# Patient Record
Sex: Female | Born: 1960 | Race: Black or African American | Hispanic: No | Marital: Married | Smoking: Current every day smoker
Health system: Southern US, Community
[De-identification: ages and names within clinical notes are randomized; demographics above are authoritative.]

## PROBLEM LIST (undated history)

## (undated) DIAGNOSIS — E785 Hyperlipidemia, unspecified: Secondary | ICD-10-CM

## (undated) DIAGNOSIS — Z923 Personal history of irradiation: Secondary | ICD-10-CM

## (undated) DIAGNOSIS — C50919 Malignant neoplasm of unspecified site of unspecified female breast: Secondary | ICD-10-CM

## (undated) DIAGNOSIS — Z972 Presence of dental prosthetic device (complete) (partial): Secondary | ICD-10-CM

## (undated) DIAGNOSIS — E119 Type 2 diabetes mellitus without complications: Secondary | ICD-10-CM

## (undated) HISTORY — PX: FOOT SURGERY: SHX648

---

## 1990-12-02 HISTORY — PX: TUBAL LIGATION: SHX77

## 2001-12-02 DIAGNOSIS — Z923 Personal history of irradiation: Secondary | ICD-10-CM

## 2001-12-02 DIAGNOSIS — C50919 Malignant neoplasm of unspecified site of unspecified female breast: Secondary | ICD-10-CM

## 2001-12-02 HISTORY — DX: Malignant neoplasm of unspecified site of unspecified female breast: C50.919

## 2001-12-02 HISTORY — PX: BREAST EXCISIONAL BIOPSY: SUR124

## 2001-12-02 HISTORY — PX: BREAST LUMPECTOMY: SHX2

## 2001-12-02 HISTORY — DX: Personal history of irradiation: Z92.3

## 2003-12-03 HISTORY — PX: TOTAL ABDOMINAL HYSTERECTOMY: SHX209

## 2004-09-01 ENCOUNTER — Ambulatory Visit: Payer: Self-pay | Admitting: Internal Medicine

## 2005-02-25 ENCOUNTER — Ambulatory Visit: Payer: Self-pay | Admitting: Internal Medicine

## 2005-03-02 ENCOUNTER — Ambulatory Visit: Payer: Self-pay | Admitting: Internal Medicine

## 2005-03-26 ENCOUNTER — Ambulatory Visit: Payer: Self-pay | Admitting: Internal Medicine

## 2005-04-01 ENCOUNTER — Ambulatory Visit: Payer: Self-pay | Admitting: Internal Medicine

## 2005-08-26 ENCOUNTER — Ambulatory Visit: Payer: Self-pay | Admitting: Internal Medicine

## 2005-09-01 ENCOUNTER — Ambulatory Visit: Payer: Self-pay | Admitting: Internal Medicine

## 2006-02-24 ENCOUNTER — Ambulatory Visit: Payer: Self-pay | Admitting: Internal Medicine

## 2006-03-02 ENCOUNTER — Ambulatory Visit: Payer: Self-pay | Admitting: Internal Medicine

## 2006-04-09 ENCOUNTER — Ambulatory Visit: Payer: Self-pay | Admitting: Internal Medicine

## 2006-04-24 ENCOUNTER — Ambulatory Visit: Payer: Self-pay | Admitting: Internal Medicine

## 2006-08-21 ENCOUNTER — Ambulatory Visit: Payer: Self-pay | Admitting: Internal Medicine

## 2006-09-01 ENCOUNTER — Ambulatory Visit: Payer: Self-pay | Admitting: Internal Medicine

## 2006-10-02 ENCOUNTER — Ambulatory Visit: Payer: Self-pay | Admitting: Internal Medicine

## 2006-11-01 ENCOUNTER — Ambulatory Visit: Payer: Self-pay | Admitting: Internal Medicine

## 2007-02-17 ENCOUNTER — Ambulatory Visit: Payer: Self-pay | Admitting: Internal Medicine

## 2007-03-03 ENCOUNTER — Ambulatory Visit: Payer: Self-pay | Admitting: Internal Medicine

## 2007-04-22 ENCOUNTER — Ambulatory Visit: Payer: Self-pay | Admitting: Internal Medicine

## 2007-04-23 ENCOUNTER — Ambulatory Visit: Payer: Self-pay | Admitting: Radiation Oncology

## 2007-05-03 ENCOUNTER — Ambulatory Visit: Payer: Self-pay | Admitting: Radiation Oncology

## 2007-06-09 ENCOUNTER — Ambulatory Visit: Payer: Self-pay | Admitting: Internal Medicine

## 2007-10-03 ENCOUNTER — Ambulatory Visit: Payer: Self-pay | Admitting: Internal Medicine

## 2007-10-16 ENCOUNTER — Ambulatory Visit: Payer: Self-pay | Admitting: Internal Medicine

## 2007-11-02 ENCOUNTER — Ambulatory Visit: Payer: Self-pay | Admitting: Internal Medicine

## 2008-04-01 ENCOUNTER — Ambulatory Visit: Payer: Self-pay | Admitting: Radiation Oncology

## 2008-04-29 ENCOUNTER — Ambulatory Visit: Payer: Self-pay | Admitting: Internal Medicine

## 2008-05-02 ENCOUNTER — Ambulatory Visit: Payer: Self-pay | Admitting: Internal Medicine

## 2008-05-02 ENCOUNTER — Ambulatory Visit: Payer: Self-pay | Admitting: Radiation Oncology

## 2008-10-02 ENCOUNTER — Ambulatory Visit: Payer: Self-pay | Admitting: Internal Medicine

## 2008-10-10 ENCOUNTER — Ambulatory Visit: Payer: Self-pay | Admitting: Internal Medicine

## 2008-11-01 ENCOUNTER — Ambulatory Visit: Payer: Self-pay | Admitting: Internal Medicine

## 2009-05-02 ENCOUNTER — Ambulatory Visit: Payer: Self-pay | Admitting: Internal Medicine

## 2010-06-12 ENCOUNTER — Ambulatory Visit: Payer: Self-pay | Admitting: Internal Medicine

## 2011-06-17 ENCOUNTER — Ambulatory Visit: Payer: Self-pay | Admitting: Internal Medicine

## 2012-06-17 ENCOUNTER — Ambulatory Visit: Payer: Self-pay | Admitting: Internal Medicine

## 2013-07-02 ENCOUNTER — Ambulatory Visit: Payer: Self-pay

## 2013-08-26 ENCOUNTER — Ambulatory Visit: Payer: Self-pay | Admitting: Gastroenterology

## 2013-08-26 HISTORY — PX: COLONOSCOPY: SHX174

## 2013-12-23 ENCOUNTER — Ambulatory Visit: Payer: Self-pay

## 2014-02-07 ENCOUNTER — Ambulatory Visit: Payer: Self-pay | Admitting: Family

## 2014-02-16 ENCOUNTER — Ambulatory Visit: Payer: Self-pay | Admitting: Gastroenterology

## 2014-02-16 HISTORY — PX: ESOPHAGOGASTRODUODENOSCOPY: SHX1529

## 2014-08-05 ENCOUNTER — Ambulatory Visit: Payer: Self-pay | Admitting: Physician Assistant

## 2015-02-07 ENCOUNTER — Ambulatory Visit: Payer: Self-pay | Admitting: Physician Assistant

## 2015-08-04 ENCOUNTER — Other Ambulatory Visit: Payer: Self-pay | Admitting: Physician Assistant

## 2015-08-04 DIAGNOSIS — R921 Mammographic calcification found on diagnostic imaging of breast: Secondary | ICD-10-CM

## 2015-08-18 ENCOUNTER — Ambulatory Visit
Admission: RE | Admit: 2015-08-18 | Discharge: 2015-08-18 | Disposition: A | Discharge status: Home / Self Care | Payer: PRIVATE HEALTH INSURANCE | Source: Ambulatory Visit | Attending: Physician Assistant | Admitting: Physician Assistant

## 2015-08-18 DIAGNOSIS — R921 Mammographic calcification found on diagnostic imaging of breast: Secondary | ICD-10-CM | POA: Insufficient documentation

## 2015-08-18 DIAGNOSIS — Z853 Personal history of malignant neoplasm of breast: Secondary | ICD-10-CM | POA: Insufficient documentation

## 2015-08-18 HISTORY — DX: Malignant neoplasm of unspecified site of unspecified female breast: C50.919

## 2015-10-11 HISTORY — PX: TRIGGER FINGER RELEASE: SHX641

## 2016-07-19 ENCOUNTER — Other Ambulatory Visit: Payer: Self-pay | Admitting: Physician Assistant

## 2016-07-19 DIAGNOSIS — C50912 Malignant neoplasm of unspecified site of left female breast: Secondary | ICD-10-CM

## 2016-08-20 ENCOUNTER — Ambulatory Visit
Admission: RE | Admit: 2016-08-20 | Discharge: 2016-08-20 | Disposition: A | Discharge status: Home / Self Care | Payer: PRIVATE HEALTH INSURANCE | Source: Ambulatory Visit | Attending: Physician Assistant | Admitting: Physician Assistant

## 2016-08-20 DIAGNOSIS — C50912 Malignant neoplasm of unspecified site of left female breast: Secondary | ICD-10-CM | POA: Insufficient documentation

## 2016-08-20 DIAGNOSIS — R921 Mammographic calcification found on diagnostic imaging of breast: Secondary | ICD-10-CM | POA: Insufficient documentation

## 2016-08-20 HISTORY — DX: Personal history of irradiation: Z92.3

## 2017-02-27 ENCOUNTER — Other Ambulatory Visit: Payer: Self-pay | Admitting: Student

## 2017-02-28 ENCOUNTER — Other Ambulatory Visit: Payer: Self-pay | Admitting: Student

## 2017-02-28 DIAGNOSIS — M779 Enthesopathy, unspecified: Principal | ICD-10-CM

## 2017-02-28 DIAGNOSIS — M778 Other enthesopathies, not elsewhere classified: Secondary | ICD-10-CM

## 2017-03-07 ENCOUNTER — Ambulatory Visit
Admission: RE | Admit: 2017-03-07 | Discharge: 2017-03-07 | Disposition: A | Discharge status: Home / Self Care | Payer: Managed Care, Other (non HMO) | Source: Ambulatory Visit | Attending: Student | Admitting: Student

## 2017-03-07 DIAGNOSIS — S46812A Strain of other muscles, fascia and tendons at shoulder and upper arm level, left arm, initial encounter: Secondary | ICD-10-CM | POA: Diagnosis not present

## 2017-03-07 DIAGNOSIS — M778 Other enthesopathies, not elsewhere classified: Secondary | ICD-10-CM

## 2017-03-07 DIAGNOSIS — X58XXXA Exposure to other specified factors, initial encounter: Secondary | ICD-10-CM | POA: Insufficient documentation

## 2017-03-07 DIAGNOSIS — M779 Enthesopathy, unspecified: Secondary | ICD-10-CM

## 2017-07-15 ENCOUNTER — Other Ambulatory Visit: Payer: Self-pay | Admitting: Physician Assistant

## 2017-07-15 DIAGNOSIS — Z1231 Encounter for screening mammogram for malignant neoplasm of breast: Secondary | ICD-10-CM

## 2017-08-21 ENCOUNTER — Ambulatory Visit
Admission: RE | Admit: 2017-08-21 | Discharge: 2017-08-21 | Disposition: A | Discharge status: Home / Self Care | Payer: Managed Care, Other (non HMO) | Source: Ambulatory Visit | Attending: Physician Assistant | Admitting: Physician Assistant

## 2017-08-21 DIAGNOSIS — Z1231 Encounter for screening mammogram for malignant neoplasm of breast: Secondary | ICD-10-CM | POA: Diagnosis not present

## 2017-09-10 ENCOUNTER — Encounter: Payer: Self-pay | Admitting: *Deleted

## 2017-09-17 ENCOUNTER — Ambulatory Visit
Admission: RE | Admit: 2017-09-17 | Discharge: 2017-09-17 | Disposition: A | Discharge status: Home / Self Care | Payer: Managed Care, Other (non HMO) | Source: Ambulatory Visit | Attending: Surgery | Admitting: Surgery

## 2017-09-17 ENCOUNTER — Ambulatory Visit: Payer: Managed Care, Other (non HMO) | Admitting: Student in an Organized Health Care Education/Training Program

## 2017-09-17 ENCOUNTER — Encounter
Admission: RE | Disposition: A | Discharge status: Home / Self Care | Payer: Self-pay | Source: Ambulatory Visit | Attending: Surgery

## 2017-09-17 DIAGNOSIS — M7712 Lateral epicondylitis, left elbow: Secondary | ICD-10-CM | POA: Diagnosis present

## 2017-09-17 DIAGNOSIS — Z7984 Long term (current) use of oral hypoglycemic drugs: Secondary | ICD-10-CM | POA: Diagnosis not present

## 2017-09-17 DIAGNOSIS — K760 Fatty (change of) liver, not elsewhere classified: Secondary | ICD-10-CM | POA: Diagnosis not present

## 2017-09-17 DIAGNOSIS — E781 Pure hyperglyceridemia: Secondary | ICD-10-CM | POA: Insufficient documentation

## 2017-09-17 DIAGNOSIS — E119 Type 2 diabetes mellitus without complications: Secondary | ICD-10-CM | POA: Diagnosis not present

## 2017-09-17 DIAGNOSIS — Z87891 Personal history of nicotine dependence: Secondary | ICD-10-CM | POA: Insufficient documentation

## 2017-09-17 DIAGNOSIS — Z79899 Other long term (current) drug therapy: Secondary | ICD-10-CM | POA: Diagnosis not present

## 2017-09-17 DIAGNOSIS — Z853 Personal history of malignant neoplasm of breast: Secondary | ICD-10-CM | POA: Insufficient documentation

## 2017-09-17 HISTORY — DX: Type 2 diabetes mellitus without complications: E11.9

## 2017-09-17 HISTORY — PX: ELBOW ARTHROSCOPY: SHX614

## 2017-09-17 HISTORY — DX: Presence of dental prosthetic device (complete) (partial): Z97.2

## 2017-09-17 HISTORY — DX: Hyperlipidemia, unspecified: E78.5

## 2017-09-17 LAB — GLUCOSE, CAPILLARY
GLUCOSE-CAPILLARY: 104 mg/dL — AB (ref 65–99)
Glucose-Capillary: 89 mg/dL (ref 65–99)

## 2017-09-17 SURGERY — ARTHROSCOPY, ELBOW, WITH OPEN SURGERY IF INDICATED
Anesthesia: General | Laterality: Left | Wound class: Clean

## 2017-09-17 MED ORDER — PROPOFOL 10 MG/ML IV BOLUS
INTRAVENOUS | Status: DC | PRN
  Administered 2017-09-17: 200 mg via INTRAVENOUS

## 2017-09-17 MED ORDER — HYDROCODONE-ACETAMINOPHEN 5-325 MG PO TABS: 1.0000 | ORAL_TABLET | Freq: Four times a day (QID) | ORAL | 0 refills | Status: AC | PRN

## 2017-09-17 MED ORDER — BUPIVACAINE HCL (PF) 0.5 % IJ SOLN
INTRAMUSCULAR | Status: DC | PRN
  Administered 2017-09-17: 15 mL

## 2017-09-17 MED ORDER — MIDAZOLAM HCL 2 MG/2ML IJ SOLN
INTRAMUSCULAR | Status: DC | PRN
  Administered 2017-09-17: 2 mg via INTRAVENOUS

## 2017-09-17 MED ORDER — DEXAMETHASONE SODIUM PHOSPHATE 4 MG/ML IJ SOLN
INTRAMUSCULAR | Status: DC | PRN
  Administered 2017-09-17: 8 mg via INTRAVENOUS

## 2017-09-17 MED ORDER — SODIUM CHLORIDE 0.9 % IV SOLN
2000.0000 mg | Freq: Once | INTRAVENOUS | Status: AC
  Administered 2017-09-17: 2000 mg via INTRAVENOUS

## 2017-09-17 MED ORDER — LIDOCAINE HCL (CARDIAC) 20 MG/ML IV SOLN
INTRAVENOUS | Status: DC | PRN
  Administered 2017-09-17: 50 mg via INTRATRACHEAL

## 2017-09-17 MED ORDER — LACTATED RINGERS IV SOLN
10.0000 mL/h | INTRAVENOUS | Status: DC
  Administered 2017-09-17: 10 mL/h via INTRAVENOUS

## 2017-09-17 MED ORDER — FENTANYL CITRATE (PF) 100 MCG/2ML IJ SOLN
INTRAMUSCULAR | Status: DC | PRN
  Administered 2017-09-17: 100 ug via INTRAVENOUS

## 2017-09-17 MED ORDER — ONDANSETRON HCL 4 MG/2ML IJ SOLN
INTRAMUSCULAR | Status: DC | PRN
  Administered 2017-09-17: 4 mg via INTRAVENOUS

## 2017-09-17 MED ORDER — ROPIVACAINE HCL 5 MG/ML IJ SOLN
INTRAMUSCULAR | Status: DC | PRN
  Administered 2017-09-17: 30 mL via EPIDURAL

## 2017-09-17 SURGICAL SUPPLY — 40 items
ANCHOR SUT 1.45 SZ 1 SHORT (Anchor) ×3 IMPLANT
BANDAGE ELASTIC 4 CLIP ST LF (GAUZE/BANDAGES/DRESSINGS) ×3 IMPLANT
BLADE EAR TYMPAN 2.5 60D BEAV (BLADE) IMPLANT
BLADE FULL RADIUS 3.5 (BLADE) IMPLANT
BNDG ESMARK 4X12 TAN STRL LF (GAUZE/BANDAGES/DRESSINGS) ×3 IMPLANT
BUR ABRADER 4.0 W/FLUTE AQUA (MISCELLANEOUS) IMPLANT
BURR ABRADER 4.0 W/FLUTE AQUA (MISCELLANEOUS)
CANNULA SHAVER SCOPE W/RIB (CANNULA) IMPLANT
CHLORAPREP W/TINT 26ML (MISCELLANEOUS) ×3 IMPLANT
COVER LIGHT HANDLE UNIVERSAL (MISCELLANEOUS) ×6 IMPLANT
CUFF TOURN SGL QUICK 18 (TOURNIQUET CUFF) ×3 IMPLANT
DRAPE IMP U-DRAPE 54X76 (DRAPES) ×3 IMPLANT
DRAPE SHEET LG 3/4 BI-LAMINATE (DRAPES) ×3 IMPLANT
DRAPE SURG 17X11 SM STRL (DRAPES) ×3 IMPLANT
GAUZE SPONGE 4X4 12PLY STRL (GAUZE/BANDAGES/DRESSINGS) ×3 IMPLANT
GLOVE BIO SURGEON STRL SZ8 (GLOVE) ×6 IMPLANT
GLOVE INDICATOR 8.0 STRL GRN (GLOVE) ×3 IMPLANT
GOWN STRL REUS W/ TWL LRG LVL3 (GOWN DISPOSABLE) ×1 IMPLANT
GOWN STRL REUS W/ TWL XL LVL3 (GOWN DISPOSABLE) ×1 IMPLANT
GOWN STRL REUS W/TWL LRG LVL3 (GOWN DISPOSABLE) ×2
GOWN STRL REUS W/TWL XL LVL3 (GOWN DISPOSABLE) ×2
IV LACTATED RINGER IRRG 3000ML (IV SOLUTION)
IV LR IRRIG 3000ML ARTHROMATIC (IV SOLUTION) IMPLANT
KIT ROOM TURNOVER OR (KITS) ×3 IMPLANT
MANIFOLD 4PT FOR NEPTUNE1 (MISCELLANEOUS) IMPLANT
MAT BLUE FLOOR 46X72 FLO (MISCELLANEOUS) IMPLANT
NEEDLE 18GX1X1/2 (RX/OR ONLY) (NEEDLE) IMPLANT
NEEDLE HYPO 21X1.5 SAFETY (NEEDLE) ×3 IMPLANT
PACK ARTHROSCOPY KNEE (MISCELLANEOUS) ×3 IMPLANT
PAD GROUND ADULT SPLIT (MISCELLANEOUS) ×3 IMPLANT
SLING ARM LRG DEEP (SOFTGOODS) IMPLANT
SLING ARM M TX990204 (SOFTGOODS) ×3 IMPLANT
SPLINT WRIST M LT TX990308 (SOFTGOODS) ×3 IMPLANT
STRAP BODY AND KNEE 60X3 (MISCELLANEOUS) ×6 IMPLANT
SUT PROLENE 4 0 PS 2 18 (SUTURE) ×6 IMPLANT
SUT VIC AB 3-0 SH 27 (SUTURE)
SUT VIC AB 3-0 SH 27X BRD (SUTURE) IMPLANT
SYR 20CC LL (SYRINGE) ×3 IMPLANT
TUBING ARTHRO INFLOW-ONLY STRL (TUBING) IMPLANT
WAND HAND CNTRL MULTIVAC 90 (MISCELLANEOUS) IMPLANT

## 2017-09-17 NOTE — Transfer of Care (Signed)
Immediate Anesthesia Transfer of Care Note  Patient: Bethany Mcmillan  Procedure(s) Performed: OPEN DEBRIDEMENT OF THE COMMON EXTENSOR ORIGIN OF ELBOW (Left )  Patient Location: PACU  Anesthesia Type: General  Level of Consciousness: awake, alert  and patient cooperative  Airway and Oxygen Therapy: Patient Spontanous Breathing and Patient connected to supplemental oxygen  Post-op Assessment: Post-op Vital signs reviewed, Patient's Cardiovascular Status Stable, Respiratory Function Stable, Patent Airway and No signs of Nausea or vomiting  Post-op Vital Signs: Reviewed and stable  Complications: No apparent anesthesia complications

## 2017-09-17 NOTE — Discharge Instructions (Signed)
General Anesthesia, Adult, Care After These instructions provide you with information about caring for yourself after your procedure. Your health care provider may also give you more specific instructions. Your treatment has been planned according to current medical practices, but problems sometimes occur. Call your health care provider if you have any problems or questions after your procedure. What can I expect after the procedure? After the procedure, it is common to have:  Vomiting.  A sore throat.  Mental slowness.  It is common to feel:  Nauseous.  Cold or shivery.  Sleepy.  Tired.  Sore or achy, even in parts of your body where you did not have surgery.  Follow these instructions at home: For at least 24 hours after the procedure:  Do not: ? Participate in activities where you could fall or become injured. ? Drive. ? Use heavy machinery. ? Drink alcohol. ? Take sleeping pills or medicines that cause drowsiness. ? Make important decisions or sign legal documents. ? Take care of children on your own.  Rest. Eating and drinking  If you vomit, drink water, juice, or soup when you can drink without vomiting.  Drink enough fluid to keep your urine clear or pale yellow.  Make sure you have little or no nausea before eating solid foods.  Follow the diet recommended by your health care provider. General instructions  Have a responsible adult stay with you until you are awake and alert.  Return to your normal activities as told by your health care provider. Ask your health care provider what activities are safe for you.  Take over-the-counter and prescription medicines only as told by your health care provider.  If you smoke, do not smoke without supervision.  Keep all follow-up visits as told by your health care provider. This is important. Contact a health care provider if:  You continue to have nausea or vomiting at home, and medicines are not helpful.  You  cannot drink fluids or start eating again.  You cannot urinate after 8-12 hours.  You develop a skin rash.  You have fever.  You have increasing redness at the site of your procedure. Get help right away if:  You have difficulty breathing.  You have chest pain.  You have unexpected bleeding.  You feel that you are having a life-threatening or urgent problem. This information is not intended to replace advice given to you by your health care provider. Make sure you discuss any questions you have with your health care provider. Document Released: 02/24/2001 Document Revised: 04/22/2016 Document Reviewed: 11/02/2015 Elsevier Interactive Patient Education  2018 [employer].  Keep dressing dry and intact. Keep arm and hand elevated above heart level. May shower after dressing removed on postop day 4 (Sunday). Reapply Velcro wrist splint after drying off. Cover sutures with Band-Aids after drying off. Apply ice to affected area frequently. Take ibuprofen 600 mg TID with meals for 7-10 days, then as necessary. Take pain medication as prescribed or ES Tylenol when needed.  Return for follow-up in 10-14 days or as scheduled.

## 2017-09-17 NOTE — Op Note (Signed)
09/17/2017  1:45 PM  Patient:   Bethany Mcmillan  Pre-Op Diagnosis:   Chronic lateral epicondylitis, left elbow.  Post-Op Diagnosis:   Same.  Procedure:   Debridement/repair of common extensor origin, left elbow.  Surgeon:   Pascal Lux, MD  Assistant:   None  Anesthesia:   General LMA with interscalene block placed preoperatively by the anesthesiologist  Findings:   As above.  Complications:   None  EBL:   3 cc  Fluids:   900 cc crystalloid  TT:   28 minutes at 250 mmHg  Drains:   None  Closure:   3-0 Vicryl subcuticular sutures  Implants:   Biomet JuggerKnot 1.4 mm anchor x1  Brief Clinical Note:   The patient is a 56 year old female with a history of gradually worsening lateral sided left elbow pain. Her symptoms have persisted despite medications, activity modification, use of a tennis elbow strap, injections, etc. Her history and examination are consistent with chronic lateral epicondylitis. An MRI scan of the elbow demonstrated a partial-thickness tear of the common extensor origin. The patient presents at this time for debridement and repair of the common extensor origin of his left elbow.  Procedure:   The patient underwent placement of an interscalene block in the pre-operative holding area by the anesthesiologist before she was brought into the operating room and lain in the supine position. After adequate general laryngal mask anesthesia was obtained, the patient's left upper extremity was prepped with ChloraPrep solution before being draped sterilely. Preoperative antibiotics were administered. A timeout was performed to verify the appropriate surgical site before the limb was exsanguinated with an Esmarch and the tourniquet inflated to 250 mmHg. An approximately 3-4 cm incision was made over the lateral aspect of the elbow beginning at the lateral epicondyle and extending distally in line with the common extensor origin tendons. The incision was carried down  through the subcutaneous tissues to expose the common extensor origin. The extensor carpi radialis brevis tendon was identified and incised in line with the incision. The areas of significantly degenerative tendinous tissues were debrided sharply with a #15 blade down to the epicondylar region. The bone itself was roughened with a rongeur to provide a good bleeding surface for reattachment of the tendon. A Biomet 1.4 mm JuggerKnot anchor was inserted into the exposed bone of the lateral epicondyle. The sutures were passed through the tendon and tied securely to effect the repair. The wound was copiously irrigated with bacitracin saline solution using bulb irrigation before several 2-0 Vicryl interrupted sutures were used to close the longitudinal incision in the tendon in a side-to-side fashion. The subcutaneous tissues were closed using 2-0 Vicryl interrupted sutures before the skin was closed using 3-0 Vicryl subcuticular sutures. Benzoin and Steri-Strips were applied to the skin. A total of 15 cc of 0.5% plain Sensorcaine was injected in and around the incision to help with postoperative analgesia before a sterile bulky dressing was applied to the elbow. A Velcro wrist immobilizer was applied before the arm was placed into a sling. The patient then was awakened, extubated, and returned to the recovery room in satisfactory condition after tolerating the procedure well.

## 2017-09-17 NOTE — Anesthesia Postprocedure Evaluation (Signed)
Anesthesia Post Note  Patient: Bethany Mcmillan  Procedure(s) Performed: OPEN DEBRIDEMENT OF THE COMMON EXTENSOR ORIGIN OF ELBOW (Left )  Patient location during evaluation: PACU Anesthesia Type: General Level of consciousness: awake and alert Pain management: pain level controlled Vital Signs Assessment: post-procedure vital signs reviewed and stable Respiratory status: spontaneous breathing, nonlabored ventilation, respiratory function stable and patient connected to nasal cannula oxygen Cardiovascular status: blood pressure returned to baseline and stable Postop Assessment: no apparent nausea or vomiting Anesthetic complications: no    Sultana Tierney ELAINE

## 2017-09-17 NOTE — Anesthesia Procedure Notes (Signed)
Procedure Name: LMA Insertion Date/Time: 09/17/2017 12:56 PM Performed by: Londell Moh Pre-anesthesia Checklist: Patient identified, Emergency Drugs available, Suction available, Timeout performed and Patient being monitored Patient Re-evaluated:Patient Re-evaluated prior to induction Oxygen Delivery Method: Circle system utilized Preoxygenation: Pre-oxygenation with 100% oxygen Induction Type: IV induction LMA: LMA inserted LMA Size: 4.0 Number of attempts: 1 Placement Confirmation: positive ETCO2 and breath sounds checked- equal and bilateral Tube secured with: Tape

## 2017-09-17 NOTE — H&P (Signed)
Paper H&P to be scanned into permanent record. H&P reviewed and patient re-examined. No changes. 

## 2017-09-17 NOTE — Anesthesia Preprocedure Evaluation (Addendum)
Anesthesia Evaluation  Patient identified by MRN, date of birth, ID band Patient awake    Reviewed: Allergy & Precautions, H&P , NPO status , Patient's Chart, lab work & pertinent test results, reviewed documented beta blocker date and time   Airway Mallampati: II  TM Distance: >3 FB Neck ROM: full    Dental  (+) Partial Upper   Pulmonary neg pulmonary ROS, Current Smoker,    Pulmonary exam normal breath sounds clear to auscultation       Cardiovascular Exercise Tolerance: Good negative cardio ROS   Rhythm:regular Rate:Normal     Neuro/Psych negative neurological ROS  negative psych ROS   GI/Hepatic negative GI ROS, Neg liver ROS,   Endo/Other  negative endocrine ROSdiabetes  Renal/GU negative Renal ROS  negative genitourinary   Musculoskeletal   Abdominal   Peds  Hematology negative hematology ROS (+)   Anesthesia Other Findings Breast CA - treated with radiation  Reproductive/Obstetrics negative OB ROS                             Anesthesia Physical Anesthesia Plan  ASA: II  Anesthesia Plan: General   Post-op Pain Management: GA combined w/ Regional for post-op pain   Induction:   PONV Risk Score and Plan:   Airway Management Planned:   Additional Equipment:   Intra-op Plan:   Post-operative Plan:   Informed Consent: I have reviewed the patients History and Physical, chart, labs and discussed the procedure including the risks, benefits and alternatives for the proposed anesthesia with the patient or authorized representative who has indicated his/her understanding and acceptance.   Dental Advisory Given  Plan Discussed with: CRNA  Anesthesia Plan Comments:        Anesthesia Quick Evaluation

## 2017-09-17 NOTE — Anesthesia Procedure Notes (Signed)
Anesthesia Regional Block: Interscalene brachial plexus block   Pre-Anesthetic Checklist: ,, timeout performed, Correct Patient, Correct Site, Correct Laterality, Correct Procedure, Correct Position, site marked, Risks and benefits discussed,  Surgical consent,  Pre-op evaluation,  At surgeon's request and post-op pain management  Laterality: Left  Prep: chloraprep       Needles:  Injection technique: Single-shot  Needle Type: Stimulator Needle - 40     Needle Length: 4cm  Needle Gauge: 22     Additional Needles:   Procedures:,,,, ultrasound used (permanent image in chart),,,,  Narrative:  Start time: 09/17/2017 11:50 AM End time: 09/17/2017 11:55 AM Injection made incrementally with aspirations every 5 mL.  Performed by: Personally  Anesthesiologist: Marice Potter  Additional Notes: Pt tolerated procedure well.

## 2017-09-17 NOTE — Progress Notes (Signed)
Assisted Scoures with left, ultrasound guided, interscalene , supraclavicular block. Side rails up, monitors on throughout procedure. See vital signs in flow sheet. Tolerated Procedure well.

## 2017-09-18 ENCOUNTER — Encounter: Payer: Self-pay | Admitting: Surgery

## 2017-10-30 ENCOUNTER — Encounter: Payer: Self-pay | Admitting: Occupational Therapy

## 2017-10-30 ENCOUNTER — Ambulatory Visit: Payer: Managed Care, Other (non HMO) | Attending: Surgery | Admitting: Occupational Therapy

## 2017-10-30 DIAGNOSIS — M25632 Stiffness of left wrist, not elsewhere classified: Secondary | ICD-10-CM

## 2017-10-30 DIAGNOSIS — M6281 Muscle weakness (generalized): Secondary | ICD-10-CM | POA: Diagnosis present

## 2017-10-30 DIAGNOSIS — M25622 Stiffness of left elbow, not elsewhere classified: Secondary | ICD-10-CM | POA: Insufficient documentation

## 2017-10-30 DIAGNOSIS — M25522 Pain in left elbow: Secondary | ICD-10-CM

## 2017-10-30 DIAGNOSIS — L905 Scar conditions and fibrosis of skin: Secondary | ICD-10-CM | POA: Diagnosis present

## 2017-10-30 NOTE — Patient Instructions (Signed)
Can do heat on elbow  Scar massage and cica scar pad for night time use  tubigrip over elbow  Counter force splint or wrist splint -fitted Benik soft neoprene splint to wean into - should not increase pain   Prayer stretch for wrist extention  Wrist flexion AROM  Elbow to side in neutral Close fist wrist flexion AROM  Pronation  If pain free and can do 2 sets   Try extended arm  Same to AROM  Pain free it should be - try in 3-4 days   Elbow extention and flexion -with shoulder block against wall - do 3 positions - 5-8 reps  Ice at end

## 2017-10-30 NOTE — Therapy (Signed)
[facility] [address], [place], [address] Phone: [phone]   Fax:  [phone]  Occupational Therapy Evaluation  Patient Details  Name: Bethany Mcmillan MRN: [id number] Date of Birth: 1961/04/07 Referring Provider: Roland Rack   Encounter Date: 10/30/2017  OT End of Session - 10/30/17 2049    Visit Number  1    Number of Visits  6    Date for OT Re-Evaluation  12/11/17    OT Start Time  1301    OT Stop Time  1355    OT Time Calculation (min)  54 min    Activity Tolerance  Patient tolerated treatment well    Behavior During Therapy  [facility] for tasks assessed/performed       Past Medical History:  Diagnosis Date  . Breast cancer ([place]) 2003   left breast w/ radiation  . Breast cancer ([place])   . Diabetes mellitus, type 2 ([place])   . Hyperlipidemia   . Personal history of radiation therapy 2003   BREAST CA  . Wears dentures    partial upper    Past Surgical History:  Procedure Laterality Date  . BREAST EXCISIONAL BIOPSY Left 2003   positive  . COLONOSCOPY  08/26/2013  . ELBOW ARTHROSCOPY Left 09/17/2017   Procedure: OPEN DEBRIDEMENT OF THE COMMON EXTENSOR ORIGIN OF ELBOW;  Surgeon: Corky Mull, MD;  Location: [facility];  Service: Orthopedics;  Laterality: Left;  Diabetic - oral meds  . ESOPHAGOGASTRODUODENOSCOPY  02/16/2014  . FOOT SURGERY Right   . TOTAL ABDOMINAL HYSTERECTOMY  2005  . TRIGGER FINGER RELEASE Right 10/11/2015   thumb  . TUBAL LIGATION  1992    There were no vitals filed for this visit.  Subjective Assessment - 10/30/17 2037    Subjective   I cannot remember that  I did anything to cause elbow pain - had 2 shots but did not help - so had surgery 09/17/17 - dong okay - but  I think I can do better without this wrist splint and then I cannot keep elbow strap on - keeps on sliding down     Patient Stated Goals  I want to get my splint off, get my motion better and pain - so  I can use my hand and arm without pain     Currently in Pain?  Yes    Pain Score  5     Pain Location  Elbow    Pain Orientation  Left    Pain Descriptors / Indicators  Sharp;Shooting    Pain Type  Surgical pain    Pain Onset  More than a month ago    Aggravating Factors   after using it some         [facility] OT Assessment - 10/30/17 0001      Assessment   Diagnosis  L elbow debridement of common extensor     Referring Provider  Poggi    Onset Date  09/17/17      Precautions   Required Braces or Orthoses  -- Wrist splint or elbow counter force brace       Balance Screen   Has the patient fallen in the past 6 months  No      Home  Environment   Lives With  Spouse      Prior Function   Vocation  Full time employment    Leisure  light duty as Architect,  likes to do  crafts,  jig saw puzzles, tablet read and games , R hand dominant       AROM   Left Elbow Flexion  140    Left Elbow Extension  -15    Right Wrist Extension  80 Degrees    Right Wrist Flexion  85 Degrees    Left Wrist Extension  60 Degrees    Left Wrist Flexion  70 Degrees        fluidotherapy done to increase ROM in L wrist - able to move better   pt educated on HEP   Can do heat on elbow  Scar massage and cica scar pad for night time use  tubigrip over elbow  Counter force splint or wrist splint -fitted Benik soft neoprene splint to wean into - should not increase pain   Prayer stretch for wrist extention  Wrist flexion AROM  Elbow to side in neutral Close fist wrist flexion AROM  Pronation  If pain free and can do 2 sets   Try extended arm  Same to AROM  Pain free it should be - try in 3-4 days   Elbow extention and flexion -with shoulder block against wall - do 3 positions - 5-8 reps  Ice at end                OT Education - 10/30/17 2048    Education provided  Yes    Education Details  Findings discuss and HEP review    Person(s) Educated  Patient    Methods   Explanation;Demonstration;Tactile cues;Verbal cues;Handout    Comprehension  Verbalized understanding;Need further instruction;Returned demonstration       OT Short Term Goals - 10/30/17 2056      OT SHORT TERM GOAL #1   Title  Pain on PREE improve with more than 15 points     Baseline  Pain at eval on PREE is 23/50     Time  3    Period  Weeks    Status  New    Target Date  11/20/17      OT SHORT TERM GOAL #2   Title  L wrist and elbow AROM improve to WNL without increase of symptoms to initiate strengthing and  use in bathing , dressing     Baseline  see flowsheet for ROM     Time  4    Period  Weeks    Status  New    Target Date  11/27/17      OT SHORT TERM GOAL #3   Title  Pt to be independent in HEP to wear splints correctly to decrease pain and to  increase ROM and  strength in L wrist and elbow     Baseline  very little knowledge on HEP  and pain 5-10/10 per pt     Time  3    Period  Weeks    Status  New    Target Date  11/20/17        OT Long Term Goals - 10/30/17 2059      OT LONG TERM GOAL #1   Title  Pt to show increase strength in L wrist and elbow to carry 5 lbs and use hand in ADL's and crafts without increase of symptoms     Baseline  no strengthing yet -and on 5 lbs weight limited , no pushing or pulling     Time  6    Period  Weeks    Status  New  Target Date  12/11/17      OT LONG TERM GOAL #2   Title  Function score on PREE improve with more than 10 points     Baseline  Function score on PREE 17/50     Time  6    Period  Weeks    Status  New    Target Date  12/11/17            Plan - 10/30/17 2050    Clinical Impression Statement  Pt present at OT evaluation about 6 1/2 wks s/p from L elbow debridement of extensory tendon commons - pt show increase pain and scar tissuse , decrease ROM at elbow and wrist , decrease strength - limiting her functional use of L hand in ADL's and IADL's     Occupational performance deficits (Please refer to  evaluation for details):  ADL's;IADL's;Work;Play;Leisure    Rehab Potential  Good    OT Frequency  1x / week    OT Duration  6 weeks    OT Treatment/Interventions  Self-care/ADL training;Patient/family education;Splinting;Fluidtherapy;Moist Heat;Cryotherapy;Manual Therapy;Passive range of motion;Scar mobilization;Therapeutic exercise;Therapeutic exercises    Plan  assess progress with HEP and upgrade as needed     Clinical Decision Making  Limited treatment options, no task modification necessary    OT Home Exercise Plan  see pt instruction     Consulted and Agree with Plan of Care  Patient       Patient will benefit from skilled therapeutic intervention in order to improve the following deficits and impairments:  Pain, Impaired flexibility, Increased edema, Impaired sensation, Decreased scar mobility, Decreased range of motion, Decreased strength, Impaired UE functional use  Visit Diagnosis: Pain in left elbow - Plan: Ot plan of care cert/re-cert  Stiffness of left elbow, not elsewhere classified - Plan: Ot plan of care cert/re-cert  Stiffness of left wrist, not elsewhere classified - Plan: Ot plan of care cert/re-cert  Muscle weakness (generalized) - Plan: Ot plan of care cert/re-cert  Scar condition and fibrosis of skin - Plan: Ot plan of care cert/re-cert    Problem List There are no active problems to display for this patient.   Rosalyn Gess OTR/L,CLT 10/30/2017, 9:08 PM  [facility] [address], [place], [address] Phone: [phone]   Fax:  [phone]  Name: Bethany Mcmillan MRN: [id number] Date of Birth: 14-Feb-1961

## 2017-11-06 ENCOUNTER — Ambulatory Visit: Payer: Managed Care, Other (non HMO) | Attending: Surgery | Admitting: Occupational Therapy

## 2017-11-06 DIAGNOSIS — L905 Scar conditions and fibrosis of skin: Secondary | ICD-10-CM

## 2017-11-06 DIAGNOSIS — M25522 Pain in left elbow: Secondary | ICD-10-CM | POA: Insufficient documentation

## 2017-11-06 DIAGNOSIS — M6281 Muscle weakness (generalized): Secondary | ICD-10-CM | POA: Diagnosis present

## 2017-11-06 DIAGNOSIS — M25632 Stiffness of left wrist, not elsewhere classified: Secondary | ICD-10-CM | POA: Insufficient documentation

## 2017-11-06 DIAGNOSIS — M25622 Stiffness of left elbow, not elsewhere classified: Secondary | ICD-10-CM | POA: Diagnosis present

## 2017-11-06 NOTE — Patient Instructions (Signed)
1 lbs weight for wrist in all planes - supported forearm  Rubber band for digits extention  Squeeze pink foam  10 reps  1 x day   for 3 days , can increase to 2 sets if pain free and then 3rd set  Fatigue more than pain

## 2017-11-06 NOTE — Therapy (Signed)
[facility] [address], [place], [address] Phone: [phone]   Fax:  [phone]  Occupational Therapy Treatment  Patient Details  Name: Bethany Mcmillan MRN: [id number] Date of Birth: 1961-10-17 Referring Provider: Roland Rack   Encounter Date: 11/06/2017  OT End of Session - 11/06/17 0846    Visit Number  2    Number of Visits  6    Date for OT Re-Evaluation  12/11/17    OT Start Time  0730    OT Stop Time  0801    OT Time Calculation (min)  31 min    Activity Tolerance  Patient tolerated treatment well    Behavior During Therapy  [facility] for tasks assessed/performed       Past Medical History:  Diagnosis Date  . Breast cancer ([place]) 2003   left breast w/ radiation  . Breast cancer ([place])   . Diabetes mellitus, type 2 ([place])   . Hyperlipidemia   . Personal history of radiation therapy 2003   BREAST CA  . Wears dentures    partial upper    Past Surgical History:  Procedure Laterality Date  . BREAST EXCISIONAL BIOPSY Left 2003   positive  . COLONOSCOPY  08/26/2013  . ELBOW ARTHROSCOPY Left 09/17/2017   Procedure: OPEN DEBRIDEMENT OF THE COMMON EXTENSOR ORIGIN OF ELBOW;  Surgeon: Corky Mull, MD;  Location: [facility];  Service: Orthopedics;  Laterality: Left;  Diabetic - oral meds  . ESOPHAGOGASTRODUODENOSCOPY  02/16/2014  . FOOT SURGERY Right   . TOTAL ABDOMINAL HYSTERECTOMY  2005  . TRIGGER FINGER RELEASE Right 10/11/2015   thumb  . TUBAL LIGATION  1992    There were no vitals filed for this visit.  Subjective Assessment - 11/06/17 0844    Subjective   I am doing okay - better - after I use it a lot - little sore about 2/10 - but otherwise no pain - I since I took hard splint off - my wrist better - I told you - I am mostly wearing at work during day soft wrist splint and then in evening strap     Patient Stated Goals  I want to get my splint off, get my motion better and pain - so I  can use my hand and arm without pain     Currently in Pain?  Yes    Pain Score  2     Pain Location  Elbow    Pain Orientation  Left    Pain Descriptors / Indicators  Aching;Sore    Pain Onset  More than a month ago         [facility] OT Assessment - 11/06/17 0001      AROM   Left Elbow Flexion  150    Left Elbow Extension  0    Right Wrist Extension  80 Degrees    Right Wrist Flexion  85 Degrees    Left Wrist Extension  70 Degrees    Left Wrist Flexion  75 Degrees      Pt showed great progress in ROM at wrist and elbow - Elbow AROM WNL and no pain   report pain increase to 2/10 after long day and depends on what she done  After heat done some scar massage  Review and upgrade HEP  1lbs weight with arm supported  1 sets  Wrist flexion , ext, RD, UD, sup, pronation  10 reps  And rubber band  for digits extention  And gripping foam block - med pinkie  12 resp   should be pain free but fatigue - can increase every 3 days over next week to 3 sets if pain free          OT Treatments/Exercises (OP) - 11/06/17 0001      Moist Heat Therapy   Number Minutes Moist Heat  8 Minutes    Moist Heat Location  Elbow             OT Education - 11/06/17 0846    Education provided  Yes    Education Details  upgrade HEP to 1 lbs supported     Person(s) Educated  Patient    Methods  Explanation;Demonstration;Tactile cues;Verbal cues;Handout    Comprehension  Verbalized understanding;Returned demonstration;Verbal cues required;Need further instruction       OT Short Term Goals - 10/30/17 2056      OT SHORT TERM GOAL #1   Title  Pain on PREE improve with more than 15 points     Baseline  Pain at eval on PREE is 23/50     Time  3    Period  Weeks    Status  New    Target Date  11/20/17      OT SHORT TERM GOAL #2   Title  L wrist and elbow AROM improve to WNL without increase of symptoms to initiate strengthing and  use in bathing , dressing     Baseline  see flowsheet for  ROM     Time  4    Period  Weeks    Status  New    Target Date  11/27/17      OT SHORT TERM GOAL #3   Title  Pt to be independent in HEP to wear splints correctly to decrease pain and to  increase ROM and  strength in L wrist and elbow     Baseline  very little knowledge on HEP  and pain 5-10/10 per pt     Time  3    Period  Weeks    Status  New    Target Date  11/20/17        OT Long Term Goals - 10/30/17 2059      OT LONG TERM GOAL #1   Title  Pt to show increase strength in L wrist and elbow to carry 5 lbs and use hand in ADL's and crafts without increase of symptoms     Baseline  no strengthing yet -and on 5 lbs weight limited , no pushing or pulling     Time  6    Period  Weeks    Status  New    Target Date  12/11/17      OT LONG TERM GOAL #2   Title  Function score on PREE improve with more than 10 points     Baseline  Function score on PREE 17/50     Time  6    Period  Weeks    Status  New    Target Date  12/11/17            Plan - 11/06/17 0847    Clinical Impression Statement  Pt showed great progress in ROM at wrist and elbow - Elbow WNL and no pain - wrist increase but still decrease by 5-10 degrees in flexion and extnetion - able to tolerate 1 lbs strenghtening this date - pt to work on [employer] -  and fatigue with no increase pain - will follow up and upgrade HEP in week     Occupational performance deficits (Please refer to evaluation for details):  ADL's;IADL's;Work;Play;Leisure    Rehab Potential  Good    OT Frequency  1x / week    OT Duration  6 weeks    OT Treatment/Interventions  Self-care/ADL training;Splinting;Fluidtherapy;Therapeutic exercise;Ultrasound;Scar mobilization;Manual Therapy;Passive range of motion;Patient/family education    Plan  assess progress with HEP and upgrade as needed     Clinical Decision Making  Limited treatment options, no task modification necessary    OT Home Exercise Plan  see pt instruction     Consulted and  Agree with Plan of Care  Patient       Patient will benefit from skilled therapeutic intervention in order to improve the following deficits and impairments:  Pain, Impaired flexibility, Increased edema, Impaired sensation, Decreased scar mobility, Decreased range of motion, Decreased strength, Impaired UE functional use  Visit Diagnosis: Pain in left elbow  Stiffness of left elbow, not elsewhere classified  Stiffness of left wrist, not elsewhere classified  Muscle weakness (generalized)  Scar condition and fibrosis of skin    Problem List There are no active problems to display for this patient.   Rosalyn Gess OTR/L,CLT 11/06/2017, 8:51 AM  [facility] [address] [place], [address] Phone: [phone]   Fax:  [phone]  Name: KAMIKA GOODLOE MRN: [id number] Date of Birth: [birth year]

## 2017-11-13 ENCOUNTER — Ambulatory Visit: Payer: Managed Care, Other (non HMO) | Admitting: Occupational Therapy

## 2017-11-13 DIAGNOSIS — M6281 Muscle weakness (generalized): Secondary | ICD-10-CM

## 2017-11-13 DIAGNOSIS — M25522 Pain in left elbow: Secondary | ICD-10-CM | POA: Diagnosis not present

## 2017-11-13 DIAGNOSIS — L905 Scar conditions and fibrosis of skin: Secondary | ICD-10-CM

## 2017-11-13 DIAGNOSIS — M25632 Stiffness of left wrist, not elsewhere classified: Secondary | ICD-10-CM

## 2017-11-13 DIAGNOSIS — M25622 Stiffness of left elbow, not elsewhere classified: Secondary | ICD-10-CM

## 2017-11-13 NOTE — Patient Instructions (Addendum)
1lbs weight extended arm - for wrist in all planes  10 reps  1 set  Increase every 2-3 days if not increase pain - to 2nd and then 3rd set  Gripping pink foam block same  And rubber band for extention of digits   can wean out of Benik splint gradually over next week

## 2017-11-13 NOTE — Therapy (Signed)
[facility] [address] [place], [address] Phone: [phone]   Fax:  [phone]  Occupational Therapy Treatment  Patient Details  Name: Bethany Mcmillan MRN: [id number] Date of Birth: [birth year] Referring Provider (Historical): Poggi   Encounter Date: 11/13/2017  OT End of Session - 11/13/17 0907    Visit Number  3    Number of Visits  6    Date for OT Re-Evaluation  12/11/17    OT Start Time  0735    OT Stop Time  0800    OT Time Calculation (min)  25 min    Activity Tolerance  Patient tolerated treatment well    Behavior During Therapy  [facility] for tasks assessed/performed       Past Medical History:  Diagnosis Date  . Breast cancer ([place]) 2003   left breast w/ radiation  . Breast cancer ([place])   . Diabetes mellitus, type 2 ([place])   . Hyperlipidemia   . Personal history of radiation therapy 2003   BREAST CA  . Wears dentures    partial upper    Past Surgical History:  Procedure Laterality Date  . BREAST EXCISIONAL BIOPSY Left 2003   positive  . COLONOSCOPY  08/26/2013  . ELBOW ARTHROSCOPY Left 09/17/2017   Procedure: OPEN DEBRIDEMENT OF THE COMMON EXTENSOR ORIGIN OF ELBOW;  Surgeon: Corky Mull, MD;  Location: [facility];  Service: Orthopedics;  Laterality: Left;  Diabetic - oral meds  . ESOPHAGOGASTRODUODENOSCOPY  02/16/2014  . FOOT SURGERY Right   . TOTAL ABDOMINAL HYSTERECTOMY  2005  . TRIGGER FINGER RELEASE Right 10/11/2015   thumb  . TUBAL LIGATION  1992    There were no vitals filed for this visit.  Subjective Assessment - 11/13/17 0901    Subjective   Dong much better - not really pain - and doing 3 sets supported with 1 lbs weight -and has full motion - wrist only little stiff when taking off my splint -     Patient Stated Goals  I want to get my splint off, get my motion better and pain - so I can use my hand and arm without pain     Currently in Pain?  No/denies         Assess ROM and strength in Lwrist and elbow - no pain increase with resistance to wrist extention and 3rd digit extention  AROM WNL  Pt doing 3 sets supported 1 lbs for wrist in all planes  Pt upgrade to extended arm - and 1 lbs but one set - able to do without increase pain  And add to HEP   1lbs weight extended arm - for wrist in all planes  10 reps  1 set  Increase every 2-3 days if not increase pain - to 2nd and then 3rd set  Gripping pink foam block same  And rubber band for extention of digits   can wean out of Benik splint gradually over next week  - 2hrs on and off -then 3hrs off and 1 hr on   Scar assess - looking great - provided another cica scar pad for night time                    OT Education - 11/13/17 0906    Education provided  Yes    Education Details  upgrade HEP to 1 lbs but unsupported and extended arm     Person(s) Educated  Patient    Methods  Explanation;Demonstration;Tactile cues;Handout    Comprehension  Verbalized understanding;Returned demonstration       OT Short Term Goals - 10/30/17 2056      OT SHORT TERM GOAL #1   Title  Pain on PREE improve with more than 15 points     Baseline  Pain at eval on PREE is 23/50     Time  3    Period  Weeks    Status  New    Target Date  11/20/17      OT SHORT TERM GOAL #2   Title  L wrist and elbow AROM improve to WNL without increase of symptoms to initiate strengthing and  use in bathing , dressing     Baseline  see flowsheet for ROM     Time  4    Period  Weeks    Status  New    Target Date  11/27/17      OT SHORT TERM GOAL #3   Title  Pt to be independent in HEP to wear splints correctly to decrease pain and to  increase ROM and  strength in L wrist and elbow     Baseline  very little knowledge on HEP  and pain 5-10/10 per pt     Time  3    Period  Weeks    Status  New    Target Date  11/20/17        OT Long Term Goals - 10/30/17 2059      OT LONG TERM GOAL #1   Title   Pt to show increase strength in L wrist and elbow to carry 5 lbs and use hand in ADL's and crafts without increase of symptoms     Baseline  no strengthing yet -and on 5 lbs weight limited , no pushing or pulling     Time  6    Period  Weeks    Status  New    Target Date  12/11/17      OT LONG TERM GOAL #2   Title  Function score on PREE improve with more than 10 points     Baseline  Function score on PREE 17/50     Time  6    Period  Weeks    Status  New    Target Date  12/11/17            Plan - 11/13/17 0910    Clinical Impression Statement  Pt cont to show progress in ROM in L UE wrist and elbow- no pain increase - scar healing great - and able to upgrade HEP to extended arm 1 lbs - pt to work on increasing sets without increasing pain -and  can wean out of wrist neoprene splint     Occupational performance deficits (Please refer to evaluation for details):  ADL's;IADL's;Work;Play;Leisure    Rehab Potential  Good    OT Frequency  1x / week    OT Duration  4 weeks    OT Treatment/Interventions  Self-care/ADL training;Splinting;Fluidtherapy;Therapeutic exercise;Ultrasound;Scar mobilization;Manual Therapy;Passive range of motion;Patient/family education    Plan  assess progress with HEP and upgrade as needed     Clinical Decision Making  Limited treatment options, no task modification necessary    OT Home Exercise Plan  see pt instruction     Consulted and Agree with Plan of Care  Patient       Patient will benefit from skilled therapeutic intervention in order  to improve the following deficits and impairments:  Pain, Impaired flexibility, Increased edema, Impaired sensation, Decreased scar mobility, Decreased range of motion, Decreased strength, Impaired UE functional use  Visit Diagnosis: Pain in left elbow  Stiffness of left elbow, not elsewhere classified  Stiffness of left wrist, not elsewhere classified  Muscle weakness (generalized)  Scar condition and fibrosis  of skin    Problem List There are no active problems to display for this patient.   Rosalyn Gess OTR/L,CLT  11/13/2017, 9:12 AM  [facility] [address], [place], [address] Phone: [phone]   Fax:  [phone]  Name: Bethany Mcmillan MRN: [id number] Date of Birth: 02-26-1961

## 2017-11-20 ENCOUNTER — Ambulatory Visit: Payer: Managed Care, Other (non HMO) | Admitting: Occupational Therapy

## 2017-11-20 DIAGNOSIS — M25522 Pain in left elbow: Secondary | ICD-10-CM

## 2017-11-20 DIAGNOSIS — M25622 Stiffness of left elbow, not elsewhere classified: Secondary | ICD-10-CM

## 2017-11-20 DIAGNOSIS — L905 Scar conditions and fibrosis of skin: Secondary | ICD-10-CM

## 2017-11-20 DIAGNOSIS — M25632 Stiffness of left wrist, not elsewhere classified: Secondary | ICD-10-CM

## 2017-11-20 DIAGNOSIS — M6281 Muscle weakness (generalized): Secondary | ICD-10-CM

## 2017-11-20 NOTE — Therapy (Signed)
[facility] [address], [place], [address] Phone: [phone]   Fax:  [phone]  Occupational Therapy Treatment  Patient Details  Name: Bethany Mcmillan MRN: [id number] Date of Birth: 1961-07-28 Referring Provider (Historical): Poggi   Encounter Date: 11/20/2017  OT End of Session - 11/20/17 1110    Visit Number  4    Number of Visits  6    Date for OT Re-Evaluation  12/11/17    OT Start Time  0724    OT Stop Time  0756    OT Time Calculation (min)  32 min    Activity Tolerance  Patient tolerated treatment well    Behavior During Therapy  [facility] for tasks assessed/performed       Past Medical History:  Diagnosis Date  . Breast cancer ([place]) 2003   left breast w/ radiation  . Breast cancer ([place])   . Diabetes mellitus, type 2 ([place])   . Hyperlipidemia   . Personal history of radiation therapy 2003   BREAST CA  . Wears dentures    partial upper    Past Surgical History:  Procedure Laterality Date  . BREAST EXCISIONAL BIOPSY Left 2003   positive  . COLONOSCOPY  08/26/2013  . ELBOW ARTHROSCOPY Left 09/17/2017   Procedure: OPEN DEBRIDEMENT OF THE COMMON EXTENSOR ORIGIN OF ELBOW;  Surgeon: Corky Mull, MD;  Location: [facility];  Service: Orthopedics;  Laterality: Left;  Diabetic - oral meds  . ESOPHAGOGASTRODUODENOSCOPY  02/16/2014  . FOOT SURGERY Right   . TOTAL ABDOMINAL HYSTERECTOMY  2005  . TRIGGER FINGER RELEASE Right 10/11/2015   thumb  . TUBAL LIGATION  1992    There were no vitals filed for this visit.  Subjective Assessment - 11/20/17 1107    Subjective   I started the 2 lbs weight Sunday - extended arm -and wean out of soft splint - took it off all day yesterday - and had about 4/10 pain end of day at elbow     Patient Stated Goals  I want to get my splint off, get my motion better and pain - so I can use my hand and arm without pain     Currently in Pain?  No/denies          [facility] OT Assessment - 11/20/17 0001      Strength   Right Hand Grip (lbs)  60 60 extended arm     Left Hand Grip (lbs)  35 25  extended arm       Measurement for grip assess  And no pain with resistance to extended arm - or wrist extention supported  But pain with extended arm and resistance   Reinforce and review upgrade of HEP to 2 lbs - need to be supported   pt did since Sunday extended arm - unsupported  And had some increase pain - and was weaning out of wrist neoprene splint gradually to    Review and upgrade HEP  2lbs weight with arm supported  1 sets  Wrist flexion , ext, RD, UD, sup, pronation  10 reps  And rubber band for digits extention  And upgrade to teal putty for gripping  12 resp   should be pain free but fatigue - can increase every 3 days over next week to 3 sets if pain free  But starting tomorrow  And do ice massage several times during day  And wean out of soft splint  slowly again this next week          OT Treatments/Exercises (OP) - 11/20/17 0001      Moist Heat Therapy   Number Minutes Moist Heat  5 Minutes    Moist Heat Location  Elbow      Cryotherapy   Number Minutes Cryotherapy  3 Minutes    Cryotherapy Location  -- elbow     Type of Cryotherapy  Ice massage             OT Education - 11/20/17 1110    Education Details  education how to upgrade HEP and advance - ed on use of putty and 2 lbs weight correctly     Person(s) Educated  Patient    Methods  Explanation;Demonstration;Tactile cues;Verbal cues;Handout    Comprehension  Verbalized understanding;Returned demonstration       OT Short Term Goals - 10/30/17 2056      OT SHORT TERM GOAL #1   Title  Pain on PREE improve with more than 15 points     Baseline  Pain at eval on PREE is 23/50     Time  3    Period  Weeks    Status  New    Target Date  11/20/17      OT SHORT TERM GOAL #2   Title  L wrist and elbow AROM improve to WNL without increase of  symptoms to initiate strengthing and  use in bathing , dressing     Baseline  see flowsheet for ROM     Time  4    Period  Weeks    Status  New    Target Date  11/27/17      OT SHORT TERM GOAL #3   Title  Pt to be independent in HEP to wear splints correctly to decrease pain and to  increase ROM and  strength in L wrist and elbow     Baseline  very little knowledge on HEP  and pain 5-10/10 per pt     Time  3    Period  Weeks    Status  New    Target Date  11/20/17        OT Long Term Goals - 10/30/17 2059      OT LONG TERM GOAL #1   Title  Pt to show increase strength in L wrist and elbow to carry 5 lbs and use hand in ADL's and crafts without increase of symptoms     Baseline  no strengthing yet -and on 5 lbs weight limited , no pushing or pulling     Time  6    Period  Weeks    Status  New    Target Date  12/11/17      OT LONG TERM GOAL #2   Title  Function score on PREE improve with more than 10 points     Baseline  Function score on PREE 17/50     Time  6    Period  Weeks    Status  New    Target Date  12/11/17            Plan - 11/20/17 1111    Clinical Impression Statement  Pt showed great progress up to this week - she upgrade to 2 lbs this past week - but did not support - did extended arm - and also did not wear wrist brace at all yesterday - pt to do supported starting tomorow -  2 lbs - pain free and putty add - and inrease every 3 days but supported to 3 sets up to until next week appt with me     Occupational performance deficits (Please refer to evaluation for details):  ADL's;IADL's;Work;Play;Leisure    Rehab Potential  Good    OT Frequency  1x / week    OT Duration  4 weeks    OT Treatment/Interventions  Self-care/ADL training;Splinting;Fluidtherapy;Therapeutic exercise;Ultrasound;Scar mobilization;Manual Therapy;Passive range of motion;Patient/family education    Plan  assess pain and progress with 2 lbs supported - grip - and upgrade as needed      Clinical Decision Making  Limited treatment options, no task modification necessary    OT Home Exercise Plan  see pt instruction     Consulted and Agree with Plan of Care  Patient       Patient will benefit from skilled therapeutic intervention in order to improve the following deficits and impairments:  Pain, Impaired flexibility, Increased edema, Impaired sensation, Decreased scar mobility, Decreased range of motion, Decreased strength, Impaired UE functional use  Visit Diagnosis: Pain in left elbow  Stiffness of left elbow, not elsewhere classified  Stiffness of left wrist, not elsewhere classified  Muscle weakness (generalized)  Scar condition and fibrosis of skin    Problem List There are no active problems to display for this patient.   Rosalyn Gess OTR/L,CLT  11/20/2017, 11:15 AM  [facility] [address] [place], [address] Phone: [phone]   Fax:  [phone]  Name: Bethany Mcmillan MRN: [id number] Date of Birth: 1961-07-11

## 2017-11-20 NOTE — Patient Instructions (Signed)
Reinforce for pt to do 2 lbs BUT supported  And only 1 sets  Fatigue - no pain    Review and upgrade HEP  2lbs weight with arm supported  1 sets  Wrist flexion , ext, RD, UD, sup, pronation  10 reps  And rubber band for digits extention  And gripping med putty - teal   12 resp   should be pain free but fatigue - can increase every 3 days over next week to 3 sets if pain free

## 2017-11-27 ENCOUNTER — Ambulatory Visit: Payer: Managed Care, Other (non HMO) | Admitting: Occupational Therapy

## 2017-11-27 DIAGNOSIS — M25622 Stiffness of left elbow, not elsewhere classified: Secondary | ICD-10-CM

## 2017-11-27 DIAGNOSIS — L905 Scar conditions and fibrosis of skin: Secondary | ICD-10-CM

## 2017-11-27 DIAGNOSIS — M6281 Muscle weakness (generalized): Secondary | ICD-10-CM

## 2017-11-27 DIAGNOSIS — M25632 Stiffness of left wrist, not elsewhere classified: Secondary | ICD-10-CM

## 2017-11-27 DIAGNOSIS — M25522 Pain in left elbow: Secondary | ICD-10-CM | POA: Diagnosis not present

## 2017-11-27 NOTE — Therapy (Signed)
[facility] [address], [place], [address] Phone: [phone]   Fax:  [phone]  Occupational Therapy Treatment  Patient Details  Name: Bethany Mcmillan MRN: [id number] Date of Birth: 19-Feb-1961 Referring Provider (Historical): Poggi   Encounter Date: 11/27/2017  OT End of Session - 11/27/17 0830    Visit Number  5    Number of Visits  6    Date for OT Re-Evaluation  12/11/17    OT Start Time  0801    OT Stop Time  0825    OT Time Calculation (min)  24 min    Activity Tolerance  Patient tolerated treatment well    Behavior During Therapy  [facility] for tasks assessed/performed       Past Medical History:  Diagnosis Date  . Breast cancer ([place]) 2003   left breast w/ radiation  . Breast cancer ([place])   . Diabetes mellitus, type 2 ([place])   . Hyperlipidemia   . Personal history of radiation therapy 2003   BREAST CA  . Wears dentures    partial upper    Past Surgical History:  Procedure Laterality Date  . BREAST EXCISIONAL BIOPSY Left 2003   positive  . COLONOSCOPY  08/26/2013  . ELBOW ARTHROSCOPY Left 09/17/2017   Procedure: OPEN DEBRIDEMENT OF THE COMMON EXTENSOR ORIGIN OF ELBOW;  Surgeon: Corky Mull, MD;  Location: [facility];  Service: Orthopedics;  Laterality: Left;  Diabetic - oral meds  . ESOPHAGOGASTRODUODENOSCOPY  02/16/2014  . FOOT SURGERY Right   . TOTAL ABDOMINAL HYSTERECTOMY  2005  . TRIGGER FINGER RELEASE Right 10/11/2015   thumb  . TUBAL LIGATION  1992    There were no vitals filed for this visit.  Subjective Assessment - 11/27/17 0826    Subjective   Doing good - I did the 1 lbs weight extendd arm and doing 2 sets - and putty - no pain like last time- using it more - still wearing wrist wrap about 2 hrs on and off     Patient Stated Goals  I want to get my splint off, get my motion better and pain - so I can use my hand and arm without pain     Currently in Pain?   No/denies         [facility] OT Assessment - 11/27/17 0001      Strength   Right Hand Grip (lbs)  60    Left Hand Grip (lbs)  45 45        Measurement for grip assess - see flowsheet - no pain ths date  And no pain with resistance to wrist extention with arm  extended or supported   Reinforce and review upgrade of HEP to 1 lbs extended arm - unsupported  Can do 2 sets for 2 days - then 1 one day 3 sets and then one day prior to appt with Dr Roland Rack - increase to 2 lbs , 1 set , unsupported    Review and upgrade HEP  1lbs weight with arm  Extended unsupported  2 sets  Wrist flexion , ext, RD, UD, sup, pronation  10 reps  And rubber band for digits extention  And  teal putty for gripping  2 sets unsupported  12 reps should be pain free but fatigue - can increase  Can upgrade to putty in 3 days - to green medium - but supported and one set and increase sets every 2-3  days   And do ice massage several times during day  And wean out of soft splint slowly again this next week  Provided new cica scar pad to use at night time                 OT Education - 11/27/17 0830    Education provided  Yes    Education Details  upgrade HEP     Person(s) Educated  Patient    Methods  Explanation;Tactile cues;Verbal cues;Handout    Comprehension  Returned demonstration       OT Short Term Goals - 11/27/17 0834      OT SHORT TERM GOAL #1   Title  Pain on PREE improve with more than 15 points     Baseline  Pain at eval on PREE is 23/50  - no pain this past week per pt     Time  3    Period  Weeks    Status  Achieved      OT SHORT TERM GOAL #2   Title  L wrist and elbow AROM improve to WNL without increase of symptoms to initiate strengthing and  use in bathing , dressing     Status  Achieved      OT SHORT TERM GOAL #3   Title  Pt to be independent in HEP to wear splints correctly to decrease pain and to  increase ROM and  strength in L wrist and elbow     Status   Achieved        OT Long Term Goals - 11/27/17 0835      OT LONG TERM GOAL #1   Title  Pt to show increase strength in L wrist and elbow to carry 5 lbs and use hand in ADL's and crafts without increase of symptoms     Baseline  pt on 2 lbs for strengthening     Time  4    Period  Weeks    Status  On-going    Target Date  12/11/17      OT LONG TERM GOAL #2   Title  Function score on PREE improve with more than 10 points     Baseline  Function score on PREE 17/50  - increase use but still favoring     Time  3    Period  Weeks    Status  On-going    Target Date  12/11/17            Plan - 11/27/17 0831    Clinical Impression Statement  Pt showed great progress again this week - after setback of last week doing 2 lbs unsupported to quick - reed and reinforce gradually increasing of 1 lbs and 2 lbs - from supported to unsupported and increase reps and sets graduall y - this date no pain with 3rd digit and wrist exention resistance  - pt provided HEP  and how to gradually increase 2 lbs unsupported to 3 lbs, putty and  weaning out of splint - she can phone me if needed to return - pt to see surgeon on 31st Dec     Occupational performance deficits (Please refer to evaluation for details):  ADL's;IADL's;Work;Play;Leisure    Rehab Potential  Good    OT Frequency  1x / week    OT Duration  2 weeks    OT Treatment/Interventions  Self-care/ADL training;Splinting;Fluidtherapy;Therapeutic exercise;Ultrasound;Scar mobilization;Manual Therapy;Passive range of motion;Patient/family education    Plan  Pt to phone  if needed to be seeen or if issues with upgrade of HEP     Clinical Decision Making  Limited treatment options, no task modification necessary    OT Home Exercise Plan  see pt instruction     Consulted and Agree with Plan of Care  Patient       Patient will benefit from skilled therapeutic intervention in order to improve the following deficits and impairments:  Pain, Impaired  flexibility, Increased edema, Impaired sensation, Decreased scar mobility, Decreased range of motion, Decreased strength, Impaired UE functional use  Visit Diagnosis: Pain in left elbow  Stiffness of left elbow, not elsewhere classified  Stiffness of left wrist, not elsewhere classified  Muscle weakness (generalized)  Scar condition and fibrosis of skin    Problem List There are no active problems to display for this patient.   Rosalyn Gess OTR/L,CLT 11/27/2017, 8:36 AM  [facility] [address], [place], [address] Phone: [phone]   Fax:  [phone]  Name: Bethany Mcmillan MRN: [id number] Date of Birth: Nov 06, 1961

## 2017-11-27 NOTE — Patient Instructions (Signed)
  Reinforce and review upgrade of HEP to 1 lbs extended arm - unsupported  Can do 2 sets for 2 days - then 1 one day 3 sets and then one day prior to appt with Dr Roland Rack - increase to 2 lbs , 1 set , unsupported    Review and upgrade HEP  1lbs weight with arm  Extended unsupported  2 sets  Wrist flexion , ext, RD, UD, sup, pronation  10 reps  And rubber band for digits extention  And  teal putty for gripping  2 sets unsupported  12 reps should be pain free but fatigue - can increase  Can upgrade to putty in 3 days - to green medium - but supported and one set and increase sets every 2-3 days   And do ice massage several times during day  And wean out of soft splint slowly again this next week

## 2018-05-12 ENCOUNTER — Other Ambulatory Visit: Payer: Self-pay | Admitting: Physician Assistant

## 2018-05-12 DIAGNOSIS — Z1231 Encounter for screening mammogram for malignant neoplasm of breast: Secondary | ICD-10-CM

## 2018-08-24 ENCOUNTER — Ambulatory Visit
Admission: RE | Admit: 2018-08-24 | Discharge: 2018-08-24 | Disposition: A | Discharge status: Home / Self Care | Payer: Managed Care, Other (non HMO) | Source: Ambulatory Visit | Attending: Physician Assistant | Admitting: Physician Assistant

## 2018-08-24 DIAGNOSIS — Z1231 Encounter for screening mammogram for malignant neoplasm of breast: Secondary | ICD-10-CM | POA: Diagnosis not present

## 2018-09-16 DIAGNOSIS — J302 Other seasonal allergic rhinitis: Secondary | ICD-10-CM | POA: Insufficient documentation

## 2019-05-07 ENCOUNTER — Other Ambulatory Visit: Payer: Self-pay | Admitting: Physician Assistant

## 2019-05-07 DIAGNOSIS — Z1231 Encounter for screening mammogram for malignant neoplasm of breast: Secondary | ICD-10-CM

## 2019-08-26 ENCOUNTER — Ambulatory Visit
Admission: RE | Admit: 2019-08-26 | Discharge: 2019-08-26 | Disposition: A | Discharge status: Home / Self Care | Payer: Managed Care, Other (non HMO) | Source: Ambulatory Visit | Attending: Physician Assistant | Admitting: Physician Assistant

## 2019-08-26 DIAGNOSIS — Z1231 Encounter for screening mammogram for malignant neoplasm of breast: Secondary | ICD-10-CM | POA: Diagnosis not present

## 2020-02-29 ENCOUNTER — Other Ambulatory Visit: Payer: Self-pay | Admitting: Internal Medicine

## 2020-02-29 DIAGNOSIS — Q799 Congenital malformation of musculoskeletal system, unspecified: Secondary | ICD-10-CM

## 2020-03-08 ENCOUNTER — Ambulatory Visit
Admission: RE | Admit: 2020-03-08 | Discharge: 2020-03-08 | Disposition: A | Discharge status: Home / Self Care | Payer: Managed Care, Other (non HMO) | Source: Ambulatory Visit | Attending: Internal Medicine | Admitting: Internal Medicine

## 2020-03-08 ENCOUNTER — Ambulatory Visit
Admission: RE | Admit: 2020-03-08 | Discharge: 2020-03-08 | Disposition: A | Discharge status: Home / Self Care | Payer: Managed Care, Other (non HMO) | Source: Home / Self Care | Attending: Internal Medicine | Admitting: Internal Medicine

## 2020-03-08 ENCOUNTER — Other Ambulatory Visit: Payer: Self-pay

## 2020-03-08 DIAGNOSIS — Q799 Congenital malformation of musculoskeletal system, unspecified: Secondary | ICD-10-CM

## 2020-03-08 LAB — BUN: BUN: 13 mg/dL (ref 6–20)

## 2020-03-08 LAB — CREATININE, SERUM
Creatinine, Ser: 0.65 mg/dL (ref 0.44–1.00)
GFR calc Af Amer: >60 mL/min (ref >60)
GFR calc non Af Amer: >60 mL/min (ref >60)

## 2020-03-08 MED ORDER — IOHEXOL 300 MG/ML  SOLN
75.0000 mL | Freq: Once | INTRAMUSCULAR | Status: AC | PRN
  Administered 2020-03-08: 75 mL via INTRAVENOUS

## 2020-03-13 ENCOUNTER — Encounter: Payer: Self-pay | Admitting: Oncology

## 2020-03-13 DIAGNOSIS — E781 Pure hyperglyceridemia: Secondary | ICD-10-CM | POA: Insufficient documentation

## 2020-03-13 DIAGNOSIS — K760 Fatty (change of) liver, not elsewhere classified: Secondary | ICD-10-CM | POA: Insufficient documentation

## 2020-03-13 DIAGNOSIS — Z8742 Personal history of other diseases of the female genital tract: Secondary | ICD-10-CM | POA: Insufficient documentation

## 2020-03-13 DIAGNOSIS — C50919 Malignant neoplasm of unspecified site of unspecified female breast: Secondary | ICD-10-CM | POA: Insufficient documentation

## 2020-03-13 DIAGNOSIS — E119 Type 2 diabetes mellitus without complications: Secondary | ICD-10-CM | POA: Insufficient documentation

## 2020-03-13 NOTE — Progress Notes (Signed)
Patient prescreened for new pt appt. Pt with history of breast cancer. Has 2 brothers who had breast cancer, one sister with pancreatic cancer, and one sister with breast cancer.

## 2020-03-14 ENCOUNTER — Inpatient Hospital Stay: Payer: Managed Care, Other (non HMO) | Attending: Oncology | Admitting: Oncology

## 2020-03-14 ENCOUNTER — Inpatient Hospital Stay: Payer: Managed Care, Other (non HMO)

## 2020-03-14 ENCOUNTER — Other Ambulatory Visit: Payer: Self-pay

## 2020-03-14 VITALS — BP 143/85 | HR 94 | Temp 96.5°F | Resp 18 | Ht 63.0 in | Wt 113.5 lb

## 2020-03-14 DIAGNOSIS — F17210 Nicotine dependence, cigarettes, uncomplicated: Secondary | ICD-10-CM | POA: Insufficient documentation

## 2020-03-14 DIAGNOSIS — Z833 Family history of diabetes mellitus: Secondary | ICD-10-CM | POA: Diagnosis not present

## 2020-03-14 DIAGNOSIS — Z853 Personal history of malignant neoplasm of breast: Secondary | ICD-10-CM

## 2020-03-14 DIAGNOSIS — Z7984 Long term (current) use of oral hypoglycemic drugs: Secondary | ICD-10-CM | POA: Diagnosis not present

## 2020-03-14 DIAGNOSIS — Z800 Family history of malignant neoplasm of digestive organs: Secondary | ICD-10-CM | POA: Insufficient documentation

## 2020-03-14 DIAGNOSIS — Z8042 Family history of malignant neoplasm of prostate: Secondary | ICD-10-CM | POA: Insufficient documentation

## 2020-03-14 DIAGNOSIS — Z803 Family history of malignant neoplasm of breast: Secondary | ICD-10-CM | POA: Insufficient documentation

## 2020-03-14 DIAGNOSIS — E119 Type 2 diabetes mellitus without complications: Secondary | ICD-10-CM | POA: Diagnosis not present

## 2020-03-14 DIAGNOSIS — Z809 Family history of malignant neoplasm, unspecified: Secondary | ICD-10-CM

## 2020-03-14 DIAGNOSIS — Z923 Personal history of irradiation: Secondary | ICD-10-CM | POA: Insufficient documentation

## 2020-03-14 DIAGNOSIS — Z79899 Other long term (current) drug therapy: Secondary | ICD-10-CM | POA: Insufficient documentation

## 2020-03-14 DIAGNOSIS — C50919 Malignant neoplasm of unspecified site of unspecified female breast: Secondary | ICD-10-CM

## 2020-03-14 DIAGNOSIS — R911 Solitary pulmonary nodule: Secondary | ICD-10-CM | POA: Diagnosis not present

## 2020-03-14 DIAGNOSIS — E785 Hyperlipidemia, unspecified: Secondary | ICD-10-CM | POA: Insufficient documentation

## 2020-03-14 DIAGNOSIS — R590 Localized enlarged lymph nodes: Secondary | ICD-10-CM

## 2020-03-14 LAB — COMPREHENSIVE METABOLIC PANEL
ALT: 14 U/L (ref 0–44)
AST: 15 U/L (ref 15–41)
Albumin: 4.2 g/dL (ref 3.5–5.0)
Alkaline Phosphatase: 60 U/L (ref 38–126)
Anion gap: 11 (ref 5–15)
BUN: 12 mg/dL (ref 6–20)
CO2: 27 mmol/L (ref 22–32)
Calcium: 9.2 mg/dL (ref 8.9–10.3)
Chloride: 104 mmol/L (ref 98–111)
Creatinine, Ser: 0.63 mg/dL (ref 0.44–1.00)
GFR calc Af Amer: >60 mL/min (ref >60)
GFR calc non Af Amer: >60 mL/min (ref >60)
Glucose, Bld: 95 mg/dL (ref 70–99)
Potassium: 4.1 mmol/L (ref 3.5–5.1)
Sodium: 142 mmol/L (ref 135–145)
Total Bilirubin: 0.5 mg/dL (ref 0.3–1.2)
Total Protein: 7.1 g/dL (ref 6.5–8.1)

## 2020-03-14 LAB — CBC WITH DIFFERENTIAL/PLATELET
Abs Immature Granulocytes: 0.05 10*3/uL (ref 0.00–0.07)
Basophils Absolute: 0.1 10*3/uL (ref 0.0–0.1)
Basophils Relative: 1 %
Eosinophils Absolute: 0.2 10*3/uL (ref 0.0–0.5)
Eosinophils Relative: 2 %
HCT: 38.4 % (ref 36.0–46.0)
Hemoglobin: 12.2 g/dL (ref 12.0–15.0)
Immature Granulocytes: 1 %
Lymphocytes Relative: 31 %
Lymphs Abs: 2.7 10*3/uL (ref 0.7–4.0)
MCH: 28.3 pg (ref 26.0–34.0)
MCHC: 31.8 g/dL (ref 30.0–36.0)
MCV: 89.1 fL (ref 80.0–100.0)
Monocytes Absolute: 0.5 10*3/uL (ref 0.1–1.0)
Monocytes Relative: 6 %
Neutro Abs: 5 10*3/uL (ref 1.7–7.7)
Neutrophils Relative %: 59 %
Platelets: 407 10*3/uL — ABNORMAL HIGH (ref 150–400)
RBC: 4.31 MIL/uL (ref 3.87–5.11)
RDW: 13.3 % (ref 11.5–15.5)
WBC: 8.5 10*3/uL (ref 4.0–10.5)
nRBC: 0 % (ref 0.0–0.2)

## 2020-03-14 NOTE — Progress Notes (Signed)
Hematology/Oncology Consult note [facility] [phone] [phone]   Patient Care Team: Marinda Elk, MD as PCP - General (Physician Assistant)  REFERRING PROVIDER: Judi Cong, MD  CHIEF COMPLAINTS/REASON FOR VISIT:  Evaluation of axillary lymphadenopathy, subpectoral lymphadenopathy.   HISTORY OF PRESENTING ILLNESS:   Bethany Mcmillan is a  59 y.o.  female with PMH listed below was seen in consultation at the request of  Solum, Betsey Holiday, MD for evaluation of axillary lymphadenopathy, subpectoral lymphadenopathy.  Patient recently complained left rib cage knot. She follows up with endocrinology and Dr.Solum obtained CT chest for evaluation.  03/08/2020 CT chest w contrast No abnormality was found at left lower chest wall, overlies left 6th and 7th costochondral junctions.  Asymmetrically enlarged left axillary and subpectoral lymph nodes, non specific 1mm nodule of the anterior right upper lobe. CAD.  Patient was referred to [facility] for further evaluation.   She has a history of left breast cancer in 2003, per patient, she is s/p left lumpectomy, and radiation. She also took 5 years tamoxifen.  Her previous pathology is not available in current EMR.  She denies any fever chill, chest pain, cough, abdominal pain, back pain.  Recent COVID 19 vaccination on her left arm.   Review of Systems  Constitutional: Negative for appetite change, chills, fatigue and fever.  HENT:   Negative for hearing loss and voice change.   Eyes: Negative for eye problems.  Respiratory: Negative for chest tightness and cough.   Cardiovascular: Negative for chest pain.  Gastrointestinal: Negative for abdominal distention, abdominal pain and blood in stool.  Endocrine: Negative for hot flashes.  Genitourinary: Negative for difficulty urinating and frequency.   Musculoskeletal: Negative for arthralgias.       Left rib case "knot".   Skin: Negative for itching  and rash.  Neurological: Negative for extremity weakness.  Hematological: Negative for adenopathy.  Psychiatric/Behavioral: Negative for confusion.    MEDICAL HISTORY:  Past Medical History:  Diagnosis Date  . Breast cancer ([place]) 2003   left breast w/ radiation  . Breast cancer ([place])   . Diabetes mellitus, type 2 ([place])   . Hyperlipidemia   . Personal history of radiation therapy 2003   BREAST CA  . Wears dentures    partial upper    SURGICAL HISTORY: Past Surgical History:  Procedure Laterality Date  . BREAST EXCISIONAL BIOPSY Left 2003   positive  . BREAST LUMPECTOMY Left 2003  . COLONOSCOPY  08/26/2013  . ELBOW ARTHROSCOPY Left 09/17/2017   Procedure: OPEN DEBRIDEMENT OF THE COMMON EXTENSOR ORIGIN OF ELBOW;  Surgeon: Corky Mull, MD;  Location: [employer];  Service: Orthopedics;  Laterality: Left;  Diabetic - oral meds  . ESOPHAGOGASTRODUODENOSCOPY  02/16/2014  . FOOT SURGERY Right   . TOTAL ABDOMINAL HYSTERECTOMY  2005  . TRIGGER FINGER RELEASE Right 10/11/2015   thumb  . TUBAL LIGATION  1992    SOCIAL HISTORY: Social History   Socioeconomic History  . Marital status: Married    Spouse name: Not on file  . Number of children: Not on file  . Years of education: Not on file  . Highest education level: Not on file  Occupational History  . Not on file  Tobacco Use  . Smoking status: Current Every Day Smoker    Packs/day: 0.25    Years: 23.00    Pack years: 5.75    Types: Cigarettes  . Smokeless tobacco: Never Used  Substance and Sexual  Activity  . Alcohol use: Yes    Comment: may have a drink 3-4x/yr  . Drug use: Never  . Sexual activity: Not on file  Other Topics Concern  . Not on file  Social History Narrative  . Not on file   Social Determinants of Health   Financial Resource Strain:   . Difficulty of Paying Living Expenses:   Food Insecurity:   . Worried About Charity fundraiser in the Last Year:   . Arboriculturist in the Last  Year:   Transportation Needs:   . Film/video editor (Medical):   Marland Kitchen Lack of Transportation (Non-Medical):   Physical Activity:   . Days of Exercise per Week:   . Minutes of Exercise per Session:   Stress:   . Feeling of Stress :   Social Connections:   . Frequency of Communication with Friends and Family:   . Frequency of Social Gatherings with Friends and Family:   . Attends Religious Services:   . Active Member of Clubs or Organizations:   . Attends Archivist Meetings:   Marland Kitchen Marital Status:   Intimate Partner Violence:   . Fear of Current or Ex-Partner:   . Emotionally Abused:   Marland Kitchen Physically Abused:   . Sexually Abused:     FAMILY HISTORY: Family History  Problem Relation Age of Onset  . Breast cancer Sister 31  . Diabetes Mother   . Prostate cancer Brother   . Pancreatic cancer Sister   . Prostate cancer Brother     ALLERGIES:  is allergic to aleve [naproxen]; comtrex cold & [phenyleph-cpm-dm-apap]; meloxicam; phenylephrine-dm; and sulfa antibiotics.  MEDICATIONS:  Current Outpatient Medications  Medication Sig Dispense Refill  . acetaminophen (TYLENOL) 500 MG tablet Take by mouth.    Marland Kitchen azelastine (ASTELIN) 0.1 % nasal spray Place 1 spray into both nostrils 2 (two) times daily.    . Dulaglutide (TRULICITY) 1.5 0000000 SOPN Inject into the skin once a week. Sunday    . ezetimibe (ZETIA) 10 MG tablet Take 10 mg by mouth daily.    Marland Kitchen HYDROcodone-acetaminophen (NORCO) 5-325 MG tablet Take 1-2 tablets by mouth every 6 (six) hours as needed for moderate pain. MAXIMUM TOTAL ACETAMINOPHEN DOSE IS 4000 MG PER DAY 20 tablet 0  . metFORMIN (GLUCOPHAGE) 500 MG tablet Take 500 mg by mouth 2 (two) times daily with a meal.    . montelukast (SINGULAIR) 10 MG tablet Take 10 mg by mouth at bedtime.    . pioglitazone (ACTOS) 15 MG tablet Take 15 mg by mouth daily.    . fexofenadine (ALLEGRA) 180 MG tablet Take 180 mg by mouth daily.    Marland Kitchen glimepiride (AMARYL) 2 MG tablet  Take 2 mg by mouth daily with breakfast.     No current facility-administered medications for this visit.     PHYSICAL EXAMINATION: ECOG PERFORMANCE STATUS: 0 - Asymptomatic Vitals:   03/14/20 1125  BP: (!) 143/85  Pulse: 94  Resp: 18  Temp: (!) 96.5 F (35.8 C)   Filed Weights   03/14/20 1125  Weight: 113 lb 8 oz (51.5 kg)    Physical Exam Constitutional:      General: She is not in acute distress. HENT:     Head: Normocephalic and atraumatic.  Eyes:     General: No scleral icterus. Cardiovascular:     Rate and Rhythm: Normal rate and regular rhythm.     Heart sounds: Normal heart sounds.  Pulmonary:  Effort: Pulmonary effort is normal. No respiratory distress.     Breath sounds: No wheezing.  Abdominal:     General: Bowel sounds are normal. There is no distension.     Palpations: Abdomen is soft.  Musculoskeletal:        General: No deformity. Normal range of motion.     Cervical back: Normal range of motion and neck supple.  Skin:    General: Skin is warm and dry.     Findings: No erythema or rash.  Neurological:     Mental Status: She is alert and oriented to person, place, and time. Mental status is at baseline.     Cranial Nerves: No cranial nerve deficit.     Coordination: Coordination normal.  Psychiatric:        Mood and Affect: Mood normal.   Breast exam was performed in seated and lying down position. Patient is status post leftlumpectomy with a well-healed surgical scar. No palpable masses. I can not appreciate left  axillary adenopathy. No palpable masses or lumps in the right breast or axillary.      LABORATORY DATA:  I have reviewed the data as listed Lab Results  Component Value Date   WBC 8.5 03/14/2020   HGB 12.2 03/14/2020   HCT 38.4 03/14/2020   MCV 89.1 03/14/2020   PLT 407 (H) 03/14/2020   Recent Labs    03/08/20 0907 03/14/20 1221  NA  --  142  K  --  4.1  CL  --  104  CO2  --  27  GLUCOSE  --  95  BUN 13 12    CREATININE 0.65 0.63  CALCIUM  --  9.2  GFRNONAA >60 >60  GFRAA >60 >60  PROT  --  7.1  ALBUMIN  --  4.2  AST  --  15  ALT  --  14  ALKPHOS  --  60  BILITOT  --  0.5   Iron/TIBC/Ferritin/ %Sat No results found for: IRON, TIBC, FERRITIN, IRONPCTSAT    RADIOGRAPHIC STUDIES: I have personally reviewed the radiological images as listed and agreed with the findings in the report. CT CHEST W CONTRAST  Result Date: 03/08/2020 CLINICAL DATA:  Left breast cancer, status post radiation therapy, palpable left lower rib mass 2 weeks, marked EXAM: CT CHEST WITH CONTRAST TECHNIQUE: Multidetector CT imaging of the chest was performed during intravenous contrast administration. CONTRAST:  55mL OMNIPAQUE IOHEXOL 300 MG/ML  SOLN COMPARISON:  None. FINDINGS: Cardiovascular: Minimal aortic atherosclerosis. Normal heart size. Scattered coronary artery calcifcations. No pericardial effusion. Mediastinum/Nodes: There are asymmetrically enlarged left axillary and subpectoral lymph nodes, largest subpectoral lymph nodes measuring 1.3 x 1.0 cm (series 2, image 29). Thyroid gland, trachea, and esophagus demonstrate no significant findings. Lungs/Pleura: Nonspecific 4 mm nodule of the anterior right upper lobe (series 2, image 41). Elongated, although triangular and benign nodule abutting the minor fissure (series 5, image 30). No pleural effusion or pneumothorax. Upper Abdomen: No acute abnormality. Musculoskeletal: No chest wall mass or suspicious bone lesions identified. No abnormality underlying palpable marker of the left lower chest wall, which overlies the left sixth and seventh costochondral junctions. IMPRESSION: 1. No abnormality underlying palpable marker of the left lower chest wall, which overlies the left sixth and seventh costochondral junctions. 2. There are asymmetrically enlarged left axillary and subpectoral lymph nodes, concerning for recurrent malignancy given history of left breast cancer. Correlate  with prior imaging of the chest, if available, and recommend mammographic evaluation. 3. Nonspecific  4 mm nodule of the anterior right upper lobe. As above, correlation with prior imaging of the chest, if available, could be helpful to assess for stability. If prior imaging is unavailable, recommend follow-up imaging of chest in 3-6 months to ensure stability given history of malignancy. 4.  Coronary artery disease.  Aortic Atherosclerosis (ICD10-I70.0). These results will be called to the ordering clinician or representative by the Radiologist Assistant, and communication documented in the PACS or [employer]. Electronically Signed   By: Eddie Candle M.D.   On: 03/08/2020 14:56      ASSESSMENT & PLAN:  1. History of left breast cancer   2. Axillary lymphadenopathy   3. Lung nodule   4. Family history of cancer    Image was independently reviewed by me and discussed with patient.  Left axillary lymphadenopathy and subpectoral lymphadenopathy.  Recent left arm vaccination. -reactive changes vs breast cancer recurrence/metastatic disease.  I will obtain left diagnostic mammogram and [place] evaluation of axillary lymphadenopathy.  Small lung nodule, reactive vs malignant. Too small for biopsy.  Pending mammogram results, she may also need PET scan.  Check CBC CMP  Family history of cancer - including breast cancer, prostate cancer ,pancreatic cancer.  I recommend patient to have genetic testing. Rational of genetic testing was discussed.  She agreed and then changed her mind and declined.  Orders Placed This Encounter  Procedures  . MM DIAG BREAST TOMO UNI LEFT    Standing Status:   Future    Standing Expiration Date:   03/14/2021    Order Specific Question:   Reason for Exam (SYMPTOM  OR DIAGNOSIS REQUIRED)    Answer:   axillary lymph adenopathy, history of breast cancer    Order Specific Question:   Is the patient pregnant?    Answer:   No    Order Specific Question:   Preferred imaging  location?    Answer:   [facility]  . US Breast Limited Uni Left Inc Axilla    Standing Status:   Future    Standing Expiration Date:   05/14/2021    Order Specific Question:   Reason for Exam (SYMPTOM  OR DIAGNOSIS REQUIRED)    Answer:   axillary lymph adenopathy, history of breast cancer    Order Specific Question:   Preferred imaging location?    Answer:   [facility]  . CBC with Differential/Platelet    Standing Status:   Future    Number of Occurrences:   1    Standing Expiration Date:   03/14/2021  . Comprehensive metabolic panel    Standing Status:   Future    Number of Occurrences:   1    Standing Expiration Date:   03/14/2021  . Ambulatory referral to Genetics    Referral Priority:   Routine    Referral Type:   Consultation    Referral Reason:   Specialty Services Required    Referred to Provider:   Faith Rogue T    Number of Visits Requested:   1    All questions were answered. The patient knows to call the clinic with any problems questions or concerns.  Return of visit: to be determined.  Thank you for this kind referral and the opportunity to participate in the care of this patient. A copy of today's note is routed to referring provider    Earlie Server, MD, PhD Hematology Oncology [facility] at [facility] Pager- [phone] 03/14/2020

## 2020-03-16 ENCOUNTER — Telehealth: Payer: Managed Care, Other (non HMO) | Admitting: Licensed Clinical Social Worker

## 2020-03-22 ENCOUNTER — Ambulatory Visit
Admission: RE | Admit: 2020-03-22 | Discharge: 2020-03-22 | Disposition: A | Discharge status: Home / Self Care | Payer: Managed Care, Other (non HMO) | Source: Ambulatory Visit | Attending: Oncology | Admitting: Oncology

## 2020-03-22 DIAGNOSIS — Z853 Personal history of malignant neoplasm of breast: Secondary | ICD-10-CM

## 2020-03-27 ENCOUNTER — Other Ambulatory Visit: Payer: Self-pay | Admitting: Oncology

## 2020-03-28 ENCOUNTER — Other Ambulatory Visit: Payer: Self-pay

## 2020-03-28 ENCOUNTER — Telehealth: Payer: Self-pay

## 2020-03-28 DIAGNOSIS — R590 Localized enlarged lymph nodes: Secondary | ICD-10-CM

## 2020-03-28 NOTE — Telephone Encounter (Signed)
-----   Message from Earlie Server, MD sent at 03/27/2020 11:17 PM EDT ----- Please arrange patient to have [place] left axillary in 3 months,  for further evaluation of enlarged LN. Follow up with me MD only after [place]. Thanks.

## 2020-03-28 NOTE — Telephone Encounter (Signed)
Done.Marland Kitchen...  Pt has been scheduled as requested for [place] and to RTC a few days later in 3 mths I will try to contact pt to make her aware. Also a reminder letter will be mailed out as well

## 2020-03-28 NOTE — Telephone Encounter (Signed)
Please schedule patient as requested and notify of appt details. Thanks.

## 2020-06-22 ENCOUNTER — Ambulatory Visit
Admission: RE | Admit: 2020-06-22 | Discharge: 2020-06-22 | Disposition: A | Discharge status: Home / Self Care | Payer: Self-pay | Source: Ambulatory Visit | Attending: Oncology | Admitting: Oncology

## 2020-06-22 DIAGNOSIS — R590 Localized enlarged lymph nodes: Secondary | ICD-10-CM | POA: Insufficient documentation

## 2020-06-23 ENCOUNTER — Telehealth: Payer: Self-pay | Admitting: Oncology

## 2020-06-23 NOTE — Telephone Encounter (Signed)
Patient phoned on this date and stated that she wanted to cancel her appt as she did not have insurance and cannot have medical bills piling up. Patient stated that she did not want to reschedule at this time. Writer requested for patient to phone back to reschedule once things were worked out and cancelled appt per patient's request.

## 2020-06-27 ENCOUNTER — Inpatient Hospital Stay: Payer: Self-pay | Admitting: Oncology

## 2021-05-08 ENCOUNTER — Other Ambulatory Visit: Payer: Self-pay | Admitting: Physician Assistant

## 2021-05-08 DIAGNOSIS — Z1231 Encounter for screening mammogram for malignant neoplasm of breast: Secondary | ICD-10-CM

## 2021-05-15 ENCOUNTER — Ambulatory Visit
Admission: RE | Admit: 2021-05-15 | Discharge: 2021-05-15 | Disposition: A | Discharge status: Home / Self Care | Payer: BC Managed Care – PPO | Source: Ambulatory Visit | Attending: Physician Assistant | Admitting: Physician Assistant

## 2021-05-15 ENCOUNTER — Other Ambulatory Visit: Payer: Self-pay

## 2021-05-15 DIAGNOSIS — Z1231 Encounter for screening mammogram for malignant neoplasm of breast: Secondary | ICD-10-CM | POA: Diagnosis present

## 2022-02-20 ENCOUNTER — Other Ambulatory Visit: Payer: Self-pay | Admitting: Physician Assistant

## 2022-02-20 DIAGNOSIS — Z1231 Encounter for screening mammogram for malignant neoplasm of breast: Secondary | ICD-10-CM

## 2022-05-16 ENCOUNTER — Ambulatory Visit
Admission: RE | Admit: 2022-05-16 | Discharge: 2022-05-16 | Disposition: A | Discharge status: Home / Self Care | Payer: BC Managed Care – PPO | Source: Ambulatory Visit | Attending: Physician Assistant | Admitting: Physician Assistant

## 2022-05-16 DIAGNOSIS — Z1231 Encounter for screening mammogram for malignant neoplasm of breast: Secondary | ICD-10-CM

## 2022-05-23 ENCOUNTER — Other Ambulatory Visit: Payer: Self-pay | Admitting: Physician Assistant

## 2022-05-23 DIAGNOSIS — N6489 Other specified disorders of breast: Secondary | ICD-10-CM

## 2022-05-23 DIAGNOSIS — R928 Other abnormal and inconclusive findings on diagnostic imaging of breast: Secondary | ICD-10-CM

## 2022-06-07 ENCOUNTER — Ambulatory Visit
Admission: RE | Admit: 2022-06-07 | Discharge: 2022-06-07 | Disposition: A | Discharge status: Home / Self Care | Payer: BC Managed Care – PPO | Source: Ambulatory Visit | Attending: Physician Assistant | Admitting: Physician Assistant

## 2022-06-07 DIAGNOSIS — N6489 Other specified disorders of breast: Secondary | ICD-10-CM | POA: Insufficient documentation

## 2022-06-07 DIAGNOSIS — R928 Other abnormal and inconclusive findings on diagnostic imaging of breast: Secondary | ICD-10-CM | POA: Diagnosis present

## 2022-10-27 IMAGING — MG MM DIGITAL SCREENING BILAT W/ TOMO AND CAD
6 of 10 series · 6 of 30 positions shown · non-contrast
Comparison: Previous exam(s).

CLINICAL DATA: Screening.

EXAM:
DIGITAL SCREENING BILATERAL MAMMOGRAM WITH TOMOSYNTHESIS AND CAD
TECHNIQUE: Bilateral screening digital craniocaudal and mediolateral oblique
mammograms were obtained. Bilateral screening digital breast
tomosynthesis was performed. The images were evaluated with
computer-aided detection.

[L XCCL synth-2D]
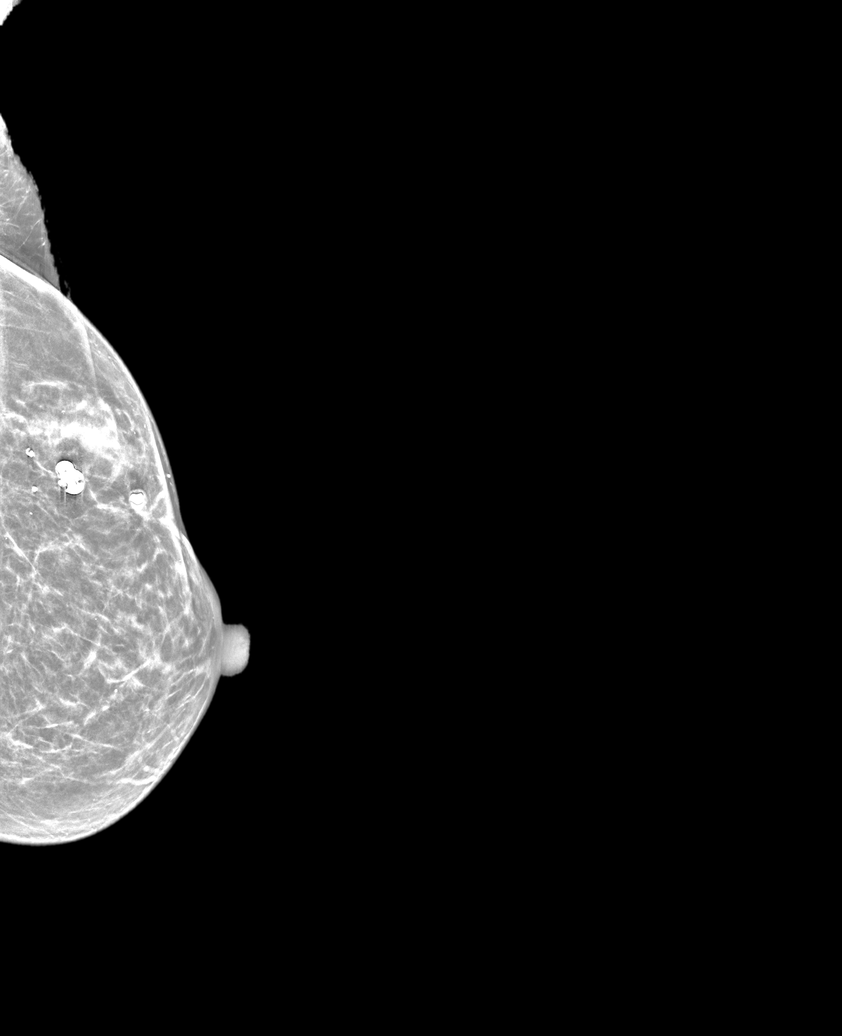

[R MLO synth-2D]
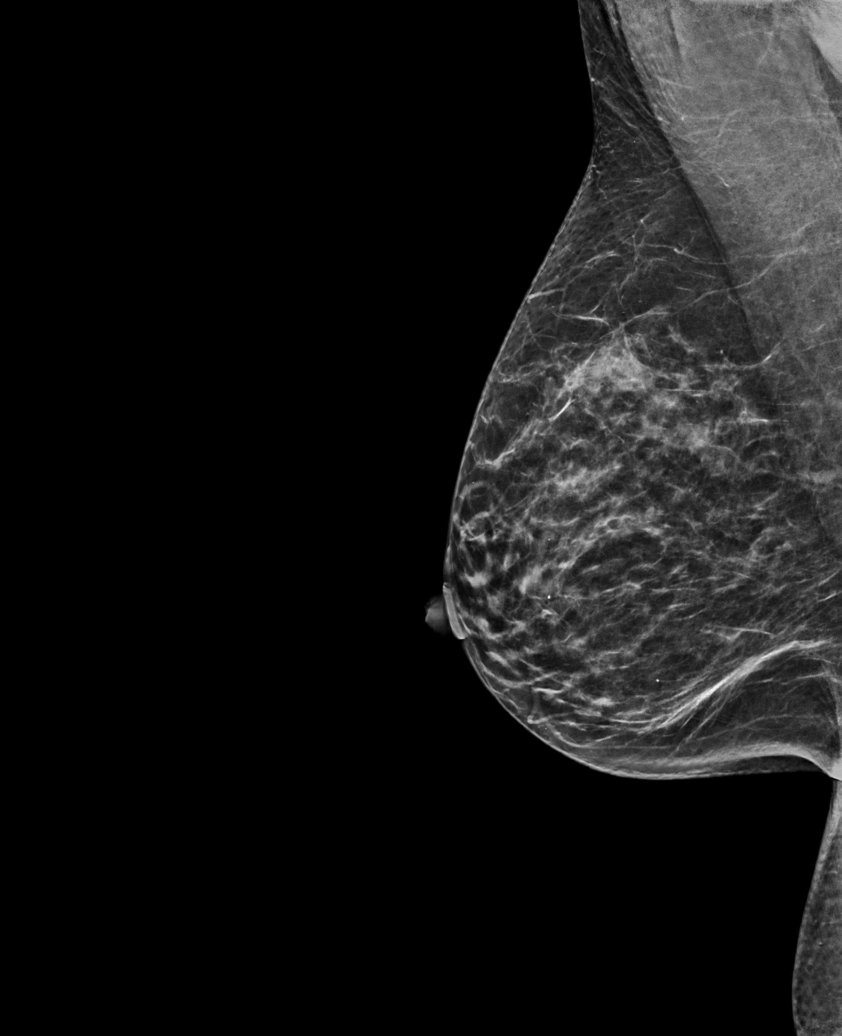

[R CC synth-2D]
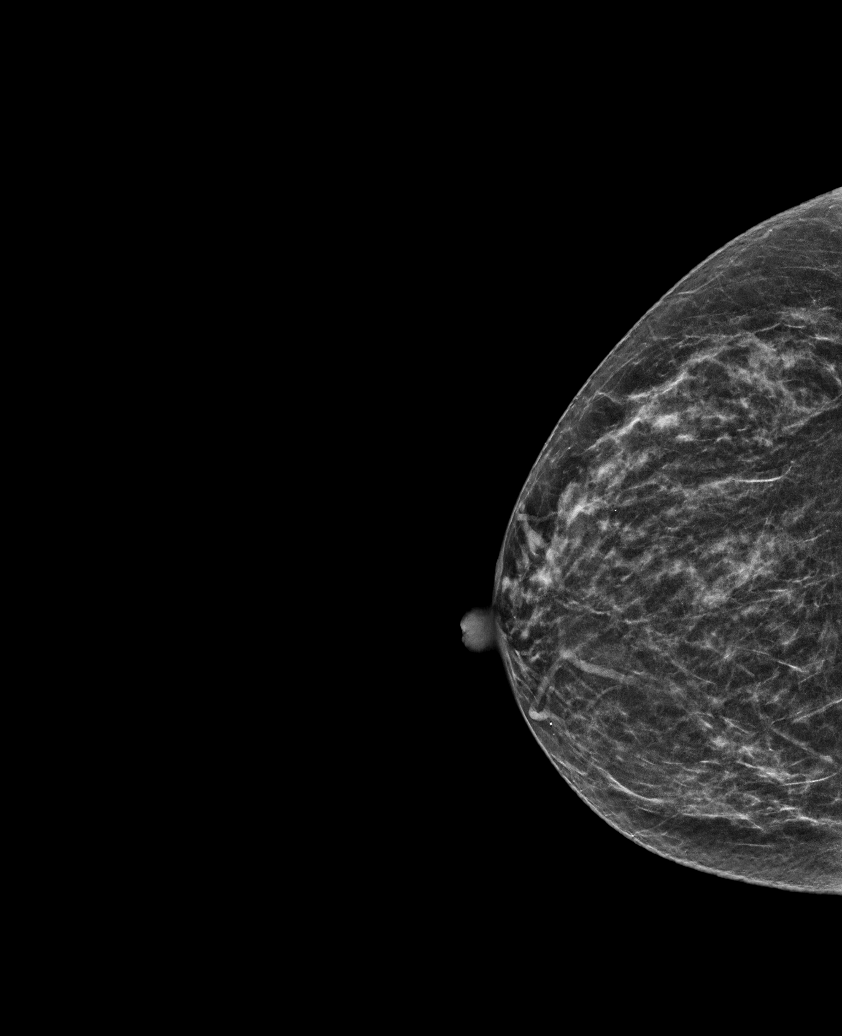

[L CC synth-2D]
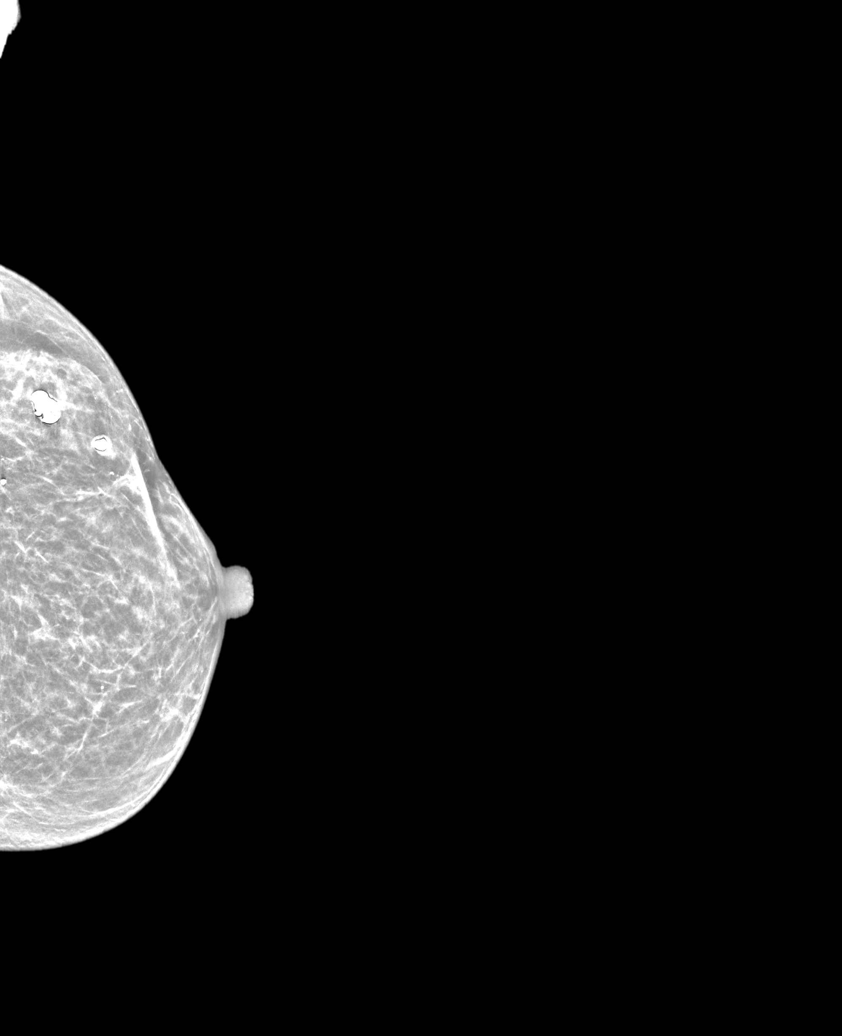

[L MLO synth-2D]
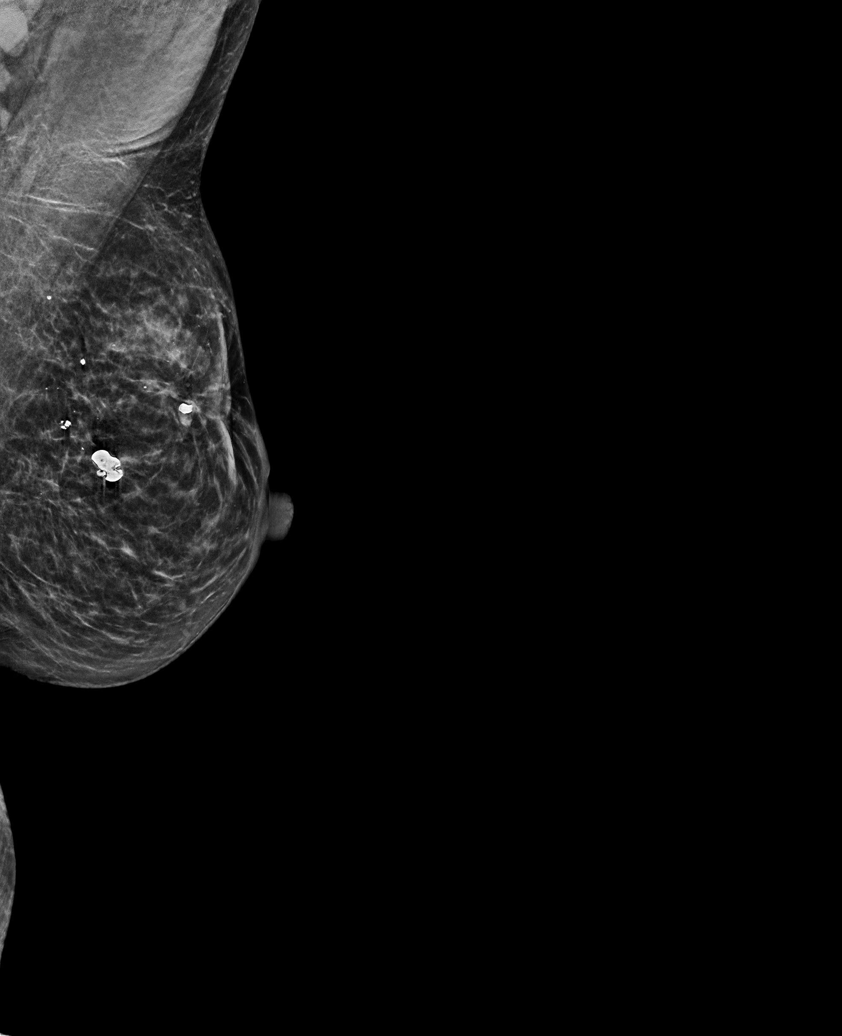

[R MLO tomo · tomo slice 33/64.0]
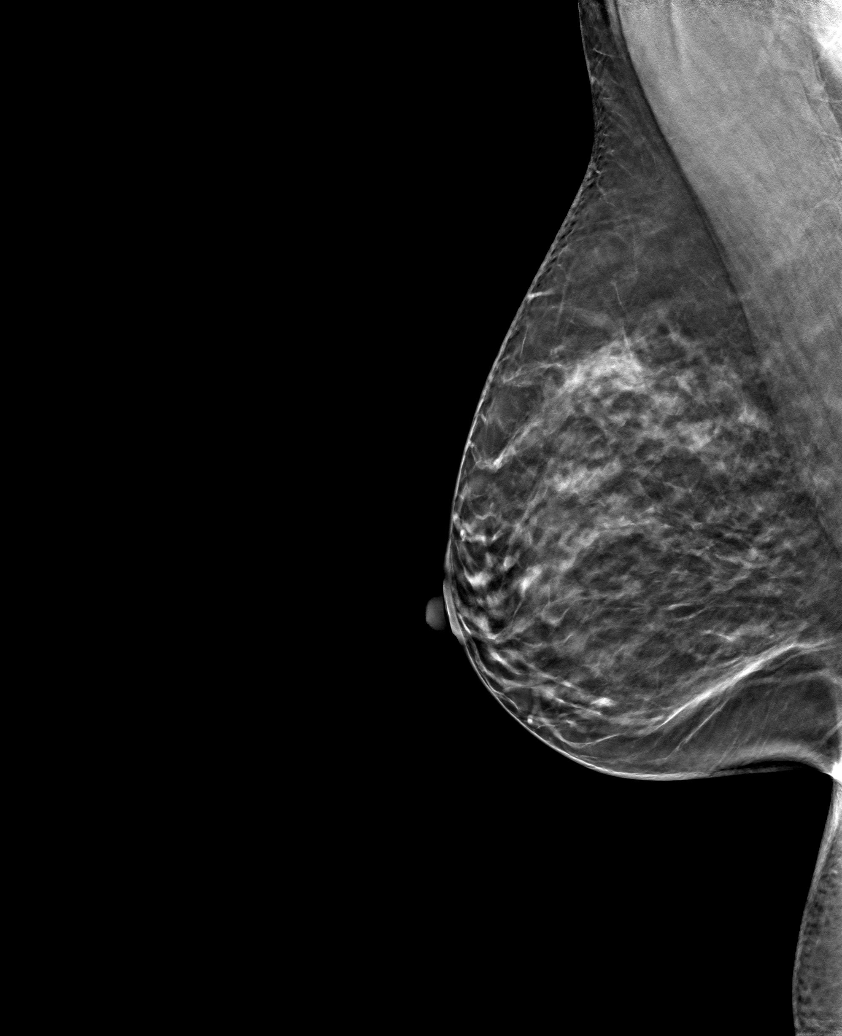

[6 of 30 positions shown; findings below may reference images not displayed]

ACR Breast Density Category b: There are scattered areas of
fibroglandular density.
FINDINGS: In the left breast, a possible asymmetry visible only on the MLO
view warrants further evaluation. In the right breast, no findings
suspicious for malignancy.
IMPRESSION: Further evaluation is suggested for a possible asymmetry in the left
breast.

RECOMMENDATION:
Diagnostic mammogram and possibly ultrasound of the left breast.
(Code:M8-V-002)

The patient will be contacted regarding the findings, and additional
imaging will be scheduled.

BI-RADS CATEGORY  0: Incomplete. Need additional imaging evaluation
and/or prior mammograms for comparison.

## 2023-02-26 ENCOUNTER — Other Ambulatory Visit: Payer: Self-pay | Admitting: Physician Assistant

## 2023-02-26 DIAGNOSIS — Z1231 Encounter for screening mammogram for malignant neoplasm of breast: Secondary | ICD-10-CM

## 2023-05-20 ENCOUNTER — Ambulatory Visit
Admission: RE | Admit: 2023-05-20 | Discharge: 2023-05-20 | Disposition: A | Discharge status: Home / Self Care | Payer: No Typology Code available for payment source | Source: Ambulatory Visit | Attending: Physician Assistant | Admitting: Physician Assistant

## 2023-05-20 DIAGNOSIS — Z1231 Encounter for screening mammogram for malignant neoplasm of breast: Secondary | ICD-10-CM | POA: Insufficient documentation

## 2024-03-01 ENCOUNTER — Other Ambulatory Visit: Payer: Self-pay | Admitting: Physician Assistant

## 2024-03-01 DIAGNOSIS — N6321 Unspecified lump in the left breast, upper outer quadrant: Secondary | ICD-10-CM

## 2024-03-02 ENCOUNTER — Ambulatory Visit
Admission: RE | Admit: 2024-03-02 | Discharge: 2024-03-02 | Discharge status: Home / Self Care | Source: Ambulatory Visit | Attending: Physician Assistant | Admitting: Physician Assistant

## 2024-03-02 ENCOUNTER — Ambulatory Visit
Admission: RE | Admit: 2024-03-02 | Discharge: 2024-03-02 | Disposition: A | Discharge status: Home / Self Care | Source: Ambulatory Visit | Attending: Physician Assistant | Admitting: Physician Assistant

## 2024-03-02 DIAGNOSIS — N6321 Unspecified lump in the left breast, upper outer quadrant: Secondary | ICD-10-CM | POA: Insufficient documentation

## 2024-05-24 ENCOUNTER — Ambulatory Visit

## 2024-05-24 DIAGNOSIS — Z1211 Encounter for screening for malignant neoplasm of colon: Secondary | ICD-10-CM | POA: Diagnosis present

## 2024-05-24 DIAGNOSIS — K6289 Other specified diseases of anus and rectum: Secondary | ICD-10-CM | POA: Diagnosis not present

## 2024-05-24 DIAGNOSIS — K635 Polyp of colon: Secondary | ICD-10-CM | POA: Diagnosis not present

## 2024-05-24 DIAGNOSIS — Z83719 Family history of colon polyps, unspecified: Secondary | ICD-10-CM | POA: Diagnosis not present

## 2024-05-24 DIAGNOSIS — K642 Third degree hemorrhoids: Secondary | ICD-10-CM | POA: Diagnosis not present
